# Patient Record
Sex: Female | Born: 1980 | Race: White | Hispanic: No | Marital: Married | Smoking: Never smoker
Health system: Southern US, Community
[De-identification: ages and names within clinical notes are randomized; demographics above are authoritative.]

## PROBLEM LIST (undated history)

## (undated) DIAGNOSIS — Z8669 Personal history of other diseases of the nervous system and sense organs: Secondary | ICD-10-CM

## (undated) DIAGNOSIS — N200 Calculus of kidney: Secondary | ICD-10-CM

## (undated) DIAGNOSIS — E041 Nontoxic single thyroid nodule: Secondary | ICD-10-CM

## (undated) DIAGNOSIS — K219 Gastro-esophageal reflux disease without esophagitis: Secondary | ICD-10-CM

## (undated) DIAGNOSIS — R112 Nausea with vomiting, unspecified: Secondary | ICD-10-CM

## (undated) DIAGNOSIS — F418 Other specified anxiety disorders: Secondary | ICD-10-CM

## (undated) DIAGNOSIS — G43109 Migraine with aura, not intractable, without status migrainosus: Secondary | ICD-10-CM

## (undated) DIAGNOSIS — F419 Anxiety disorder, unspecified: Secondary | ICD-10-CM

## (undated) DIAGNOSIS — D649 Anemia, unspecified: Secondary | ICD-10-CM

## (undated) DIAGNOSIS — N920 Excessive and frequent menstruation with regular cycle: Secondary | ICD-10-CM

## (undated) DIAGNOSIS — F988 Other specified behavioral and emotional disorders with onset usually occurring in childhood and adolescence: Secondary | ICD-10-CM

## (undated) DIAGNOSIS — N946 Dysmenorrhea, unspecified: Secondary | ICD-10-CM

## (undated) DIAGNOSIS — Z8489 Family history of other specified conditions: Secondary | ICD-10-CM

## (undated) HISTORY — DX: Anxiety disorder, unspecified: F41.9

## (undated) HISTORY — DX: Migraine with aura, not intractable, without status migrainosus: G43.109

## (undated) HISTORY — PX: ESOPHAGEAL DILATION: SHX303

## (undated) HISTORY — DX: Calculus of kidney: N20.0

## (undated) HISTORY — DX: Other specified behavioral and emotional disorders with onset usually occurring in childhood and adolescence: F98.8

---

## 2011-02-02 DIAGNOSIS — Z87442 Personal history of urinary calculi: Secondary | ICD-10-CM

## 2011-02-02 HISTORY — DX: Personal history of urinary calculi: Z87.442

## 2011-05-16 ENCOUNTER — Encounter (HOSPITAL_COMMUNITY): Payer: Self-pay | Admitting: *Deleted

## 2011-05-16 ENCOUNTER — Emergency Department (HOSPITAL_COMMUNITY)
Admission: EM | Admit: 2011-05-16 | Discharge: 2011-05-17 | Disposition: A | Discharge status: Home / Self Care | Payer: BC Managed Care – PPO | Attending: Emergency Medicine | Admitting: Emergency Medicine

## 2011-05-16 DIAGNOSIS — N201 Calculus of ureter: Secondary | ICD-10-CM | POA: Insufficient documentation

## 2011-05-16 DIAGNOSIS — R109 Unspecified abdominal pain: Secondary | ICD-10-CM | POA: Insufficient documentation

## 2011-05-16 DIAGNOSIS — N1330 Unspecified hydronephrosis: Secondary | ICD-10-CM | POA: Insufficient documentation

## 2011-05-16 DIAGNOSIS — R110 Nausea: Secondary | ICD-10-CM | POA: Insufficient documentation

## 2011-05-16 DIAGNOSIS — N200 Calculus of kidney: Secondary | ICD-10-CM

## 2011-05-16 MED ORDER — SODIUM CHLORIDE 0.9 % IV BOLUS (SEPSIS)
1000.0000 mL | Freq: Once | INTRAVENOUS | Status: AC
  Administered 2011-05-16: 1000 mL via INTRAVENOUS

## 2011-05-16 MED ORDER — MORPHINE SULFATE 4 MG/ML IJ SOLN
4.0000 mg | Freq: Once | INTRAMUSCULAR | Status: AC
  Administered 2011-05-16: 4 mg via INTRAVENOUS
  Filled 2011-05-16: qty 1

## 2011-05-16 MED ORDER — ONDANSETRON HCL 4 MG/2ML IJ SOLN
4.0000 mg | Freq: Once | INTRAMUSCULAR | Status: AC
Start: 2011-05-16 — End: 2011-05-16
  Administered 2011-05-16: 4 mg via INTRAVENOUS
  Filled 2011-05-16: qty 2

## 2011-05-16 NOTE — ED Notes (Signed)
Pt reports left flank pain with radiation into left lower abdomen. Pt reports nausea without vomiting. Pain started an hour and a half ago and is intermittent. Pt denies history of kidney stones

## 2011-05-17 ENCOUNTER — Emergency Department (HOSPITAL_COMMUNITY): Payer: BC Managed Care – PPO

## 2011-05-17 LAB — BASIC METABOLIC PANEL
BUN: 18 mg/dL (ref 6–23)
Calcium: 9.4 mg/dL (ref 8.4–10.5)
Creatinine, Ser: 0.8 mg/dL (ref 0.50–1.10)
GFR calc non Af Amer: >90 mL/min (ref >90)
Glucose, Bld: 135 mg/dL — ABNORMAL HIGH (ref 70–99)
Sodium: 137 mEq/L (ref 135–145)

## 2011-05-17 LAB — URINALYSIS, ROUTINE W REFLEX MICROSCOPIC
Bilirubin Urine: NEGATIVE
Specific Gravity, Urine: 1.024 (ref 1.005–1.030)
Urobilinogen, UA: 0.2 mg/dL (ref 0.0–1.0)

## 2011-05-17 LAB — CBC
HCT: 38.3 % (ref 36.0–46.0)
MCH: 31.6 pg (ref 26.0–34.0)
MCV: 91 fL (ref 78.0–100.0)
Platelets: 250 10*3/uL (ref 150–400)
RBC: 4.21 MIL/uL (ref 3.87–5.11)

## 2011-05-17 LAB — POCT PREGNANCY, URINE: Preg Test, Ur: NEGATIVE

## 2011-05-17 LAB — URINE MICROSCOPIC-ADD ON

## 2011-05-17 LAB — DIFFERENTIAL
Eosinophils Absolute: 0.1 10*3/uL (ref 0.0–0.7)
Eosinophils Relative: 1 % (ref 0–5)
Lymphs Abs: 2.3 10*3/uL (ref 0.7–4.0)
Monocytes Absolute: 0.3 10*3/uL (ref 0.1–1.0)
Monocytes Relative: 5 % (ref 3–12)

## 2011-05-17 MED ORDER — ONDANSETRON HCL 4 MG PO TABS: 4.0000 mg | ORAL_TABLET | Freq: Four times a day (QID) | ORAL | Status: AC

## 2011-05-17 MED ORDER — TAMSULOSIN HCL 0.4 MG PO CAPS: 0.4000 mg | ORAL_CAPSULE | Freq: Every day | ORAL | Status: DC

## 2011-05-17 MED ORDER — OXYCODONE-ACETAMINOPHEN 5-325 MG PO TABS: 2.0000 | ORAL_TABLET | ORAL | Status: AC | PRN

## 2011-05-17 MED ORDER — HYDROMORPHONE HCL PF 1 MG/ML IJ SOLN
0.5000 mg | Freq: Once | INTRAMUSCULAR | Status: AC
  Administered 2011-05-17: 0.5 mg via INTRAVENOUS
  Filled 2011-05-17: qty 1

## 2011-05-17 MED ORDER — ONDANSETRON HCL 4 MG/2ML IJ SOLN
4.0000 mg | Freq: Once | INTRAMUSCULAR | Status: AC
  Administered 2011-05-17: 4 mg via INTRAVENOUS
  Filled 2011-05-17: qty 2

## 2011-05-17 MED ORDER — MORPHINE SULFATE 4 MG/ML IJ SOLN
4.0000 mg | Freq: Once | INTRAMUSCULAR | Status: AC
  Administered 2011-05-17: 4 mg via INTRAVENOUS
  Filled 2011-05-17: qty 1

## 2011-05-17 MED ORDER — TAMSULOSIN HCL 0.4 MG PO CAPS
0.4000 mg | ORAL_CAPSULE | Freq: Once | ORAL | Status: AC
  Administered 2011-05-17: 0.4 mg via ORAL
  Filled 2011-05-17: qty 1

## 2011-05-17 MED ORDER — OXYCODONE-ACETAMINOPHEN 5-325 MG PO TABS
2.0000 | ORAL_TABLET | Freq: Once | ORAL | Status: AC
  Administered 2011-05-17: 2 via ORAL
  Filled 2011-05-17: qty 2

## 2011-05-17 NOTE — Discharge Instructions (Signed)
You were diagnosed with a kidney stone today.  Please contact [facility] today to schedule follow-up.  Return to the ER for worsening pain, nausea, or vomiting despite medications, fever, or other concerning symptoms.  Kidney Stones Kidney stones (ureteral lithiasis) are deposits that form inside your kidneys. The intense pain is caused by the stone moving through the urinary tract. When the stone moves, the ureter goes into spasm around the stone. The stone is usually passed in the urine.  CAUSES   A disorder that makes certain neck glands produce too much parathyroid hormone (primary hyperparathyroidism).   A buildup of uric acid crystals.   Narrowing (stricture) of the ureter.   A kidney obstruction present at birth (congenital obstruction).   Previous surgery on the kidney or ureters.   Numerous kidney infections.  SYMPTOMS   Feeling sick to your stomach (nauseous).   Throwing up (vomiting).   Blood in the urine (hematuria).   Pain that usually spreads (radiates) to the groin.   Frequency or urgency of urination.  DIAGNOSIS   Taking a history and physical exam.   Blood or urine tests.   Computerized X-ray scan (CT scan).   Occasionally, an examination of the inside of the urinary bladder (cystoscopy) is performed.  TREATMENT   Observation.   Increasing your fluid intake.   Surgery may be needed if you have severe pain or persistent obstruction.  The size, location, and chemical composition are all important variables that will determine the proper choice of action for you. Talk to your caregiver to better understand your situation so that you will minimize the risk of injury to yourself and your kidney.  HOME CARE INSTRUCTIONS   Drink enough water and fluids to keep your urine clear or pale yellow.   Strain all urine through the provided strainer. Keep all particulate matter and stones for your caregiver to see. The stone causing the pain may be as small as  a grain of salt. It is very important to use the strainer each and every time you pass your urine. The collection of your stone will allow your caregiver to analyze it and verify that a stone has actually passed.   Only take over-the-counter or prescription medicines for pain, discomfort, or fever as directed by your caregiver.   Make a follow-up appointment with your caregiver as directed.   Get follow-up X-rays if required. The absence of pain does not always mean that the stone has passed. It may have only stopped moving. If the urine remains completely obstructed, it can cause loss of kidney function or even complete destruction of the kidney. It is your responsibility to make sure X-rays and follow-ups are completed. Ultrasounds of the kidney can show blockages and the status of the kidney. Ultrasounds are not associated with any radiation and can be performed easily in a matter of minutes.  SEEK IMMEDIATE MEDICAL CARE IF:   Pain cannot be controlled with the prescribed medicine.   You have a fever.   The severity or intensity of pain increases over 18 hours and is not relieved by pain medicine.   You develop a new onset of abdominal pain.   You feel faint or pass out.  MAKE SURE YOU:   Understand these instructions.   Will watch your condition.   Will get help right away if you are not doing well or get worse.  Document Released: 01/18/2005 Document Revised: 01/07/2011 Document Reviewed: 05/16/2009 [facility] Patient Information 2012 [place], [place].

## 2011-05-17 NOTE — ED Notes (Signed)
Pt aware of need for urine specimen. Pt unable to provide one at this time. RN notified. Will continue to monitor.

## 2011-05-17 NOTE — ED Provider Notes (Signed)
History     CSN: [id number]  Arrival date & time 05/16/11  2208   First MD Initiated Contact with Patient 05/16/11 2308      Chief Complaint  Patient presents with  . Nausea  . Flank Pain    (Consider location/radiation/quality/duration/timing/severity/associated sxs/prior treatment) HPI 31 year old female presents to emergency room with with complaint of left flank pain radiating around into her abdomen. Patient reports acute onset around 9 PM tonight. Patient denies previous history of similar pain. Patient has had nausea but no vomiting with this pain. Pain comes in waves, sharp and severe. Patient reports last menstrual period was 2 weeks ago. Patient has been having normal bowel movements. No fevers. History reviewed. No pertinent past medical history.  History reviewed. No pertinent past surgical history.  No family history on file.  History  Substance Use Topics  . Smoking status: Never Smoker   . Smokeless tobacco: Not on file  . Alcohol Use: No    OB History    Grav Para Term Preterm Abortions TAB SAB Ect Mult Living                  Review of Systems  All other systems reviewed and are negative.   other than listed in history of present illness  Allergies  Review of patient's allergies indicates no known allergies.  Home Medications  No current outpatient prescriptions on file.  BP 146/65  Pulse 92  Temp(Src) 98 F (36.7 C) (Oral)  Resp 18  SpO2 97%  LMP 04/26/2011  Physical Exam  Nursing note and vitals reviewed. Constitutional: She appears well-developed and well-nourished. She appears distressed.       Patient standing on side of bed leaning over in significant pain  HENT:  Head: Normocephalic and atraumatic.  Eyes: Conjunctivae and EOM are normal. Pupils are equal, round, and reactive to light.  Neck: Normal range of motion. Neck supple. No JVD present. No tracheal deviation present. No thyromegaly present.  Cardiovascular: Normal rate,  regular rhythm, normal heart sounds and intact distal pulses.  Exam reveals no gallop and no friction rub.   No murmur heard. Pulmonary/Chest: Effort normal and breath sounds normal. No stridor. No respiratory distress. She has no wheezes. She has no rales. She exhibits no tenderness.  Abdominal: Soft. Bowel sounds are normal. She exhibits no distension and no mass. There is tenderness (tenderness with palpation of left upper quadrant and left flank with positive CVA tenderness). There is no rebound and no guarding.  Musculoskeletal: Normal range of motion. She exhibits no edema and no tenderness.  Lymphadenopathy:    She has no cervical adenopathy.  Neurological: She is alert. She exhibits normal muscle tone. Coordination normal.  Skin: Skin is warm and dry. No rash noted. No erythema. No pallor.  Psychiatric: She has a normal mood and affect. Her behavior is normal. Judgment and thought content normal.    ED Course  Procedures (including critical care time)  Labs Reviewed  URINALYSIS, ROUTINE W REFLEX MICROSCOPIC - Abnormal; Notable for the following:    APPearance CLOUDY (*)    Hgb urine dipstick SMALL (*)    Ketones, ur 15 (*)    All other components within normal limits  BASIC METABOLIC PANEL - Abnormal; Notable for the following:    Potassium 3.3 (*)    Glucose, Bld 135 (*)    All other components within normal limits  URINE MICROSCOPIC-ADD ON - Abnormal; Notable for the following:    Bacteria, UA FEW (*)  Crystals CA OXALATE CRYSTALS (*)    All other components within normal limits  CBC  DIFFERENTIAL  LACTIC ACID, PLASMA  POCT PREGNANCY, URINE   Ct Abdomen Pelvis Wo Contrast  05/17/2011  *RADIOLOGY REPORT*  Clinical Data: Left flank pain, radiating to the left lower abdomen for several hours.  CT ABDOMEN AND PELVIS WITHOUT CONTRAST  Technique:  Multidetector CT imaging of the abdomen and pelvis was performed following the standard protocol without intravenous contrast.   Comparison: None.  Findings: Minimal bibasilar atelectasis is noted.  The liver and spleen are unremarkable in appearance.  The gallbladder is within normal limits.  The pancreas and adrenal glands are unremarkable.  There is mild to moderate left-sided hydronephrosis, with left- sided perinephric stranding, and prominence of the left ureter to the level of an obstructing 6 x 5 mm stone in the distal left ureter, just proximal to the left vesicoureteral junction.  A tiny nonobstructing 3 mm stone is noted near the lower pole of the left kidney.  The right kidney is unremarkable in appearance.  No free fluid is identified.  The small bowel is unremarkable in appearance.  The stomach is within normal limits.  No acute vascular abnormalities are seen.  The appendix is normal in caliber, without evidence for appendicitis.  The colon is grossly unremarkable in appearance.  The bladder is mildly distended; mild bladder wall thickening could reflect mild cystitis.  The uterus is grossly unremarkable in appearance.  The ovaries are relatively symmetric, with a small 1.8 cm right adnexal follicle incidentally noted.  No suspicious adnexal masses are seen.  No inguinal lymphadenopathy is seen.  No acute osseous abnormalities are identified.  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  IMPRESSION:  1.  Mild to moderate left-sided hydronephrosis, with left-sided perinephric stranding, and an obstructing 6 x 5 mm stone in the distal left ureter, just proximal to the left vesicoureteral junction. 2.  Tiny nonobstructing 3 mm stone noted near the lower pole of the left kidney. 3.  Mild bladder wall thickening could reflect mild cystitis. 4.  Chronic bilateral pars defects at L5, without evidence of anterolisthesis.  Original Report Authenticated By: Tonia Ghent, M.D.     1. Nephrolithiasis       MDM  31 year old female with left flank pain that seems colicky in nature, possibly  due to 2 kidney stone. Patient to receive fluids, pain medicine. We'll check labs in urine. Patient may require CT scan for localization of stone. Other differential includes ovarian torsion, intestinal colic, or ectopic pregnancy        Olivia Mackie, MD 05/17/11 [phone]

## 2011-05-17 NOTE — ED Notes (Signed)
Pt. Alert and oriented discharged to home via w/c with husband

## 2011-05-27 ENCOUNTER — Other Ambulatory Visit: Payer: Self-pay | Admitting: Urology

## 2011-05-31 ENCOUNTER — Encounter (HOSPITAL_BASED_OUTPATIENT_CLINIC_OR_DEPARTMENT_OTHER): Payer: Self-pay | Admitting: *Deleted

## 2011-05-31 NOTE — Progress Notes (Signed)
To [facility] @1100 - KUB, Hg,urine pregnancy on arrival.Npo after mn.

## 2011-06-03 NOTE — H&P (Signed)
  History of Present Illness     Nephrolithiasis: The patient was seen in the emergency room on 05/16/11 with left flank pain and a CT scan revealed moderate left hydronephrosis due to a stone in the distal ureter measuring 6 x 5 mm. The study also revealed a single 3 mm, nonobstructing stone in the left kidney.    Interval HX: She was started on medical expulsive therapy with tamsulosin. A KUB confirmed the stone in the distal left ureter when she was here last. She has had intermittent mild discomfort in the left lower quadrant and has not seen her stone pass.   Past Medical History Problems  1. History of  Anxiety (Symptom) 300.00  Surgical History Problems  1. History of  No Surgical Problems  Current Meds 1. No Reported Medications  Allergies Medication  1. No Known Drug Allergies  Family History Problems  1. Family history of  Family Health Status Number Of Children 2 sons  Social History Problems  1. Marital History - Currently Married 2. Never A Smoker 3. Occupation: stay at home mom Denied  4. History of  Alcohol Use 5. History of  Caffeine Use 6. History of  Tobacco Use  Review of Systems Genitourinary, constitutional, skin, eye, otolaryngeal, hematologic/lymphatic, cardiovascular, pulmonary, endocrine, musculoskeletal, gastrointestinal, neurological and psychiatric system(s) were reviewed and pertinent findings if present are noted.  Gastrointestinal: nausea, vomiting and abdominal pain.  Psychiatric: anxiety.    Vitals Vital Signs  BMI Calculated: 27.11 BSA Calculated: 1.73 Height: 5 ft 3 in Weight: 153 lb  Blood Pressure: 121 / 78 Temperature: 98.4 F Heart Rate: 77  Physical Exam Constitutional: Well nourished and well developed . No acute distress. The patient appears well hydrated.  ENT:. The ears and nose are normal in appearance.  Neck: The appearance of the neck is normal.  Pulmonary: No respiratory distress.  Cardiovascular: Heart rate and  rhythm are normal.  Abdomen: The abdomen is flat. The abdomen is soft and nontender. No suprapubic tenderness. No CVA tenderness.  Genitourinary:. Deferred.  Skin: Normal skin turgor and normal skin color and pigmentation.  Neuro/Psych:. Mood and affect are appropriate.    Assessment Assessed  1. Distal Ureteral Stone On The Left 592.1 2. Nephrolithiasis Of The Left Kidney 592.0      I went over the treatment options with the patient today which would include lithotripsy versus ureteroscopy and laser lithotripsy. We discussed each of the procedures in detail including its pluses and minuses as well as risks and complications. We also discussed continued medical expulsive therapy however she would like to proceed with a definitive treatment it is elected to proceed with ureteroscopic extraction of her stone.   Plan: She will undergo left ureteroscopy with laser lithotripsy.

## 2011-06-04 ENCOUNTER — Ambulatory Visit (HOSPITAL_BASED_OUTPATIENT_CLINIC_OR_DEPARTMENT_OTHER)
Admission: RE | Admit: 2011-06-04 | Discharge: 2011-06-04 | Disposition: A | Discharge status: Home / Self Care | Payer: BC Managed Care – PPO | Source: Ambulatory Visit | Attending: Urology | Admitting: Urology

## 2011-06-04 ENCOUNTER — Ambulatory Visit (HOSPITAL_BASED_OUTPATIENT_CLINIC_OR_DEPARTMENT_OTHER): Payer: BC Managed Care – PPO | Admitting: Anesthesiology

## 2011-06-04 ENCOUNTER — Encounter (HOSPITAL_BASED_OUTPATIENT_CLINIC_OR_DEPARTMENT_OTHER): Payer: Self-pay | Admitting: Anesthesiology

## 2011-06-04 ENCOUNTER — Encounter (HOSPITAL_BASED_OUTPATIENT_CLINIC_OR_DEPARTMENT_OTHER)
Admission: RE | Disposition: A | Discharge status: Home / Self Care | Payer: Self-pay | Source: Ambulatory Visit | Attending: Urology

## 2011-06-04 ENCOUNTER — Ambulatory Visit (HOSPITAL_COMMUNITY): Payer: BC Managed Care – PPO

## 2011-06-04 ENCOUNTER — Encounter (HOSPITAL_BASED_OUTPATIENT_CLINIC_OR_DEPARTMENT_OTHER): Payer: Self-pay | Admitting: *Deleted

## 2011-06-04 DIAGNOSIS — N200 Calculus of kidney: Secondary | ICD-10-CM | POA: Insufficient documentation

## 2011-06-04 DIAGNOSIS — N201 Calculus of ureter: Secondary | ICD-10-CM

## 2011-06-04 HISTORY — DX: Calculus of kidney: N20.0

## 2011-06-04 HISTORY — PX: URETEROSCOPY: SHX842

## 2011-06-04 LAB — POCT PREGNANCY, URINE: Preg Test, Ur: NEGATIVE

## 2011-06-04 LAB — POCT HEMOGLOBIN-HEMACUE: Hemoglobin: 13 g/dL (ref 12.0–15.0)

## 2011-06-04 SURGERY — URETEROSCOPY
Anesthesia: General | Laterality: Left

## 2011-06-04 MED ORDER — DEXAMETHASONE SODIUM PHOSPHATE 4 MG/ML IJ SOLN
INTRAMUSCULAR | Status: DC | PRN
  Administered 2011-06-04: 10 mg via INTRAVENOUS

## 2011-06-04 MED ORDER — LACTATED RINGERS IV SOLN
INTRAVENOUS | Status: DC
  Administered 2011-06-04: 12:00:00 via INTRAVENOUS

## 2011-06-04 MED ORDER — PROPOFOL 10 MG/ML IV EMUL
INTRAVENOUS | Status: DC | PRN
  Administered 2011-06-04: 200 mg via INTRAVENOUS

## 2011-06-04 MED ORDER — KETOROLAC TROMETHAMINE 30 MG/ML IJ SOLN
INTRAMUSCULAR | Status: DC | PRN
  Administered 2011-06-04: 30 mg via INTRAVENOUS

## 2011-06-04 MED ORDER — CIPROFLOXACIN IN D5W 200 MG/100ML IV SOLN
200.0000 mg | INTRAVENOUS | Status: AC
  Administered 2011-06-04: 200 mg via INTRAVENOUS

## 2011-06-04 MED ORDER — LIDOCAINE HCL (CARDIAC) 20 MG/ML IV SOLN
INTRAVENOUS | Status: DC | PRN
  Administered 2011-06-04: 100 mg via INTRAVENOUS

## 2011-06-04 MED ORDER — MIDAZOLAM HCL 5 MG/5ML IJ SOLN
INTRAMUSCULAR | Status: DC | PRN
  Administered 2011-06-04: 2 mg via INTRAVENOUS

## 2011-06-04 MED ORDER — ONDANSETRON HCL 4 MG/2ML IJ SOLN
INTRAMUSCULAR | Status: DC | PRN
  Administered 2011-06-04: 4 mg via INTRAVENOUS

## 2011-06-04 MED ORDER — BELLADONNA ALKALOIDS-OPIUM 16.2-60 MG RE SUPP
RECTAL | Status: DC | PRN
  Administered 2011-06-04: 1 via RECTAL

## 2011-06-04 MED ORDER — FENTANYL CITRATE 0.05 MG/ML IJ SOLN
INTRAMUSCULAR | Status: DC | PRN
  Administered 2011-06-04: 100 ug via INTRAVENOUS

## 2011-06-04 MED ORDER — SODIUM CHLORIDE 0.9 % IR SOLN
Status: DC | PRN
  Administered 2011-06-04: 500 mL

## 2011-06-04 MED ORDER — PHENAZOPYRIDINE HCL 200 MG PO TABS: 200.0000 mg | ORAL_TABLET | Freq: Three times a day (TID) | ORAL | Status: AC | PRN

## 2011-06-04 MED ORDER — FENTANYL CITRATE 0.05 MG/ML IJ SOLN: 25.0000 ug | INTRAMUSCULAR | Status: DC | PRN

## 2011-06-04 MED ORDER — IOHEXOL 350 MG/ML SOLN
INTRAVENOUS | Status: DC | PRN
  Administered 2011-06-04: 5 mL

## 2011-06-04 SURGICAL SUPPLY — 22 items
ADAPTER CATH URET PLST 4-6FR (CATHETERS) IMPLANT
BAG DRAIN URO-CYSTO SKYTR STRL (DRAIN) ×2 IMPLANT
BASKET LASER NITINOL 1.9FR (BASKET) IMPLANT
BASKET STNLS GEMINI 4WIRE 3FR (BASKET) IMPLANT
BASKET ZERO TIP NITINOL 2.4FR (BASKET) ×2 IMPLANT
CANISTER SUCT LVC 12 LTR MEDI- (MISCELLANEOUS) IMPLANT
CATH INTERMIT  6FR 70CM (CATHETERS) ×2 IMPLANT
CLOTH BEACON ORANGE TIMEOUT ST (SAFETY) ×2 IMPLANT
DRAPE CAMERA CLOSED 9X96 (DRAPES) ×2 IMPLANT
ELECT REM PT RETURN 9FT ADLT (ELECTROSURGICAL)
ELECTRODE REM PT RTRN 9FT ADLT (ELECTROSURGICAL) IMPLANT
GLOVE BIO SURGEON STRL SZ8 (GLOVE) ×2 IMPLANT
GOWN PREVENTION PLUS LG XLONG (DISPOSABLE) ×2 IMPLANT
GOWN STRL REIN XL XLG (GOWN DISPOSABLE) ×2 IMPLANT
GUIDEWIRE 0.038 PTFE COATED (WIRE) IMPLANT
GUIDEWIRE ANG ZIPWIRE 038X150 (WIRE) IMPLANT
GUIDEWIRE STR DUAL SENSOR (WIRE) ×2 IMPLANT
IV NS IRRIG 3000ML ARTHROMATIC (IV SOLUTION) ×4 IMPLANT
LASER FIBER DISP (UROLOGICAL SUPPLIES) ×2 IMPLANT
PACK CYSTOSCOPY (CUSTOM PROCEDURE TRAY) ×2 IMPLANT
SHEATH URET ACCESS 12FR/35CM (UROLOGICAL SUPPLIES) ×2 IMPLANT
WATER STERILE IRR 3000ML UROMA (IV SOLUTION) IMPLANT

## 2011-06-04 NOTE — Anesthesia Preprocedure Evaluation (Signed)
Anesthesia Evaluation  Patient identified by MRN, date of birth, ID band Patient awake    Reviewed: Allergy & Precautions, H&P , NPO status , Patient's Chart, lab work & pertinent test results, reviewed documented beta blocker date and time   Airway Mallampati: II TM Distance: >3 FB Neck ROM: Full    Dental  (+) Teeth Intact and Dental Advisory Given   Pulmonary neg pulmonary ROS,  breath sounds clear to auscultation        Cardiovascular negative cardio ROS  Rhythm:Regular Rate:Normal  Denies cardiac symptoms   Neuro/Psych negative neurological ROS  negative psych ROS   GI/Hepatic negative GI ROS, Neg liver ROS,   Endo/Other  negative endocrine ROS  Renal/GU Kidney stone  negative genitourinary   Musculoskeletal negative musculoskeletal ROS (+)   Abdominal   Peds negative pediatric ROS (+)  Hematology negative hematology ROS (+)   Anesthesia Other Findings Upper front cap  Reproductive/Obstetrics negative OB ROS                           Anesthesia Physical Anesthesia Plan  ASA: I  Anesthesia Plan: General   Post-op Pain Management:    Induction: Intravenous  Airway Management Planned: LMA  Additional Equipment:   Intra-op Plan:   Post-operative Plan: Extubation in OR  Informed Consent: I have reviewed the patients History and Physical, chart, labs and discussed the procedure including the risks, benefits and alternatives for the proposed anesthesia with the patient or authorized representative who has indicated his/her understanding and acceptance.   Dental advisory given  Plan Discussed with: CRNA and Surgeon  Anesthesia Plan Comments:         Anesthesia Quick Evaluation

## 2011-06-04 NOTE — Anesthesia Postprocedure Evaluation (Signed)
  Anesthesia Post-op Note  Patient: Brittany Hart  Procedure(s) Performed: Procedure(s) (LRB): URETEROSCOPY (Left) HOLMIUM LASER APPLICATION (Left)  Patient Location: PACU  Anesthesia Type: General  Level of Consciousness: awake and alert   Airway and Oxygen Therapy: Patient Spontanous Breathing  Post-op Pain: mild  Post-op Assessment: Post-op Vital signs reviewed, Patient's Cardiovascular Status Stable, Respiratory Function Stable, Patent Airway and No signs of Nausea or vomiting  Post-op Vital Signs: stable  Complications: No apparent anesthesia complications

## 2011-06-04 NOTE — Transfer of Care (Signed)
Immediate Anesthesia Transfer of Care Note  Patient: Brittany Hart  Procedure(s) Performed: Procedure(s) (LRB): URETEROSCOPY (Left) HOLMIUM LASER APPLICATION (Left)  Patient Location: PACU  Anesthesia Type: General  Level of Consciousness: awake, alert  and oriented  Airway & Oxygen Therapy: Patient Spontanous Breathing and Patient connected to face mask oxygen  Post-op Assessment: Report given to PACU RN and Post -op Vital signs reviewed and stable  Post vital signs: Reviewed and stable  Complications: No apparent anesthesia complications

## 2011-06-04 NOTE — Anesthesia Procedure Notes (Signed)
Procedure Name: LMA Insertion Date/Time: 06/04/2011 12:16 PM Performed by: Norva Pavlov Pre-anesthesia Checklist: Patient identified, Emergency Drugs available, Suction available and Patient being monitored Patient Re-evaluated:Patient Re-evaluated prior to inductionOxygen Delivery Method: Circle System Utilized Preoxygenation: Pre-oxygenation with 100% oxygen Intubation Type: IV induction Ventilation: Mask ventilation without difficulty LMA: LMA inserted LMA Size: 4.0 Number of attempts: 1 Airway Equipment and Method: bite block Placement Confirmation: positive ETCO2 Tube secured with: Tape Dental Injury: Teeth and Oropharynx as per pre-operative assessment

## 2011-06-04 NOTE — Discharge Instructions (Signed)
Post stone removal surgery instructions   Definitions:  Ureter: The duct that transports urine from the kidney to the bladder. Stent: A plastic hollow tube that is placed into the ureter, from the kidney to the bladder to prevent the ureter from swelling shut.  General instructions:  Despite the fact that no skin incisions were used, the area around the ureter and bladder is raw and irritated. The stent is a foreign body which will further irritate the bladder wall. This irritation is manifested by increased frequency of urination, both day and night, and by an increase in the urge to urinate. In some, the urge to urinate is present almost always. Sometimes the urge is strong enough that you may not be able to stop your self from urinating. The only real cure is to remove the stent and then give time for the bladder wall to heal which can't be done until the danger of the ureter swelling shut has passed. (This varies from 2-21 days).  You may see some blood in your urine while the stent is in place and a few days afterward. Do not be alarmed, even if the urine is clear for a while. Get off your feet and drink lots of fluids until clearing occurs. If you start to pass clots or don't improve, call us.  If you have a string coming from your urethra:  The stent string is attached to your ureteral stent.  Do not pull on thisIf you have a string coming from your urethra:  The stent string is attached to your ureteral stent.  Do not pull on this.  Diet:  You may return to your normal diet immediately. Because of the raw surface of your bladder, alcohol, spicy foods, foods high in acid and drinks with caffeine may cause irritation or frequency and should be used in moderation. To keep your urine flowing freely and avoid constipation, drink plenty of fluids during the day (8-10 glasses). Tip: Avoid cranberry juice because it is very acidic.  Activity:  Your physical activity doesn't need to be  restricted. However, if you are very active, you may see some blood in the urine. We suggest that you reduce your activity under the circumstances until the bleeding has stopped.  Bowels:  It is important to keep your bowels regular during the postoperative period. Straining with bowel movements can cause bleeding. A bowel movement every other day is reasonable. Use a mild laxative if needed, such as milk of magnesia 2-3 tablespoons, or 2 Dulcolax tablets. Call if you continue to have problems. If you had been taking narcotics for pain, before, during or after your surgery, you may be constipated. Take a laxative if necessary.     Medication:  You should resume your pre-surgery medications unless told not to. DO NOT RESUME YOUR ASPIRIN, or any other medicines like ibuprofen, motrin, excedrin, advil, aleve, vitamin E, fish oil as these can all cause bleeding x 7 days. In addition you may be given an antibiotic to prevent or treat infection. Antibiotics are not always necessary. All medication should be taken as prescribed until the bottles are finished unless you are having an unusual reaction to one of the drugs.  Problems you should report to [place]:  a. Fever greater than 101F. b. Heavy bleeding, or clots (see notes above about blood in urine). c. Inability to urinate. d. Drug reactions (hives, rash, nausea, vomiting, diarrhea). e. Severe burning or pain with urination that is not improving.  Followup:  You  will need a followup appointment to monitor your progress in most cases. Please call the office for this appointment when you get home if your appointment has not already been scheduled. Usually the first appointment will be about 5-14 days after your surgery and if you have a stent in place it will likely be removed at that time.  Post Anesthesia Home Care Instructions  Activity: Get plenty of rest for the remainder of the day. A responsible adult should stay with you for 24 hours  following the procedure.  For the next 24 hours, DO NOT: -Drive a car -Advertising copywriter -Drink alcoholic beverages -Take any medication unless instructed by your physician -Make any legal decisions or sign important papers.  Meals: Start with liquid foods such as gelatin or soup. Progress to regular foods as tolerated. Avoid greasy, spicy, heavy foods. If nausea and/or vomiting occur, drink only clear liquids until the nausea and/or vomiting subsides. Call your physician if vomiting continues.  Special Instructions/Symptoms: Your throat may feel dry or sore from the anesthesia or the breathing tube placed in your throat during surgery. If this causes discomfort, gargle with warm salt water. The discomfort should disappear within 24 hours.

## 2011-06-04 NOTE — Op Note (Signed)
PATIENT:  Brittany Hart  PRE-OPERATIVE DIAGNOSIS:  left Ureteral calculus  POST-OPERATIVE DIAGNOSIS: Same  PROCEDURE: 1. Cystoscopy with left retrograde pyelogram including interpretation. 2. Left ureteroscopy with laser lithotripsy. 3. Left ureteroscopic stone extraction  SURGEON: Garnett Farm, MD  INDICATION: [name] is a 31 year old female patient with a left distal ureteral stone. She was placed on medical expulsive therapy however her stone failed to progress. She presents today for ureteroscopic treatment of her stone. ANESTHESIA:  General  EBL:  Minimal  DRAINS: None  SPECIMEN:  Stone  DISPOSITION OF SPECIMEN:  given to patient  DESCRIPTION OF PROCEDURE: The patient was taken to the major OR and placed on the table. General anesthesia was administered and then the patient was moved to the dorsal lithotomy position. The genitalia was sterilely prepped and draped. An official timeout was performed.  Initially the 22 French cystoscope with 12 lens was passed under direct vision into the bladder. The bladder was then entered and fully inspected. It was noted be free of any tumors stones or inflammatory lesions. Ureteral orifices were of normal configuration and position. A 6 French open-ended ureteral catheter was then passed through the cystoscope into the ureteral orifice in order to perform a left retrograde pyelogram.  A retrograde pyelogram was performed by injecting full-strength contrast up the left ureter under direct fluoroscopic control. It revealed a filling defect in the distal lureter consistent with the stone seen on the preoperative KUB. The remainder of the ureter was noted to be normal as was the intrarenal collecting system. I then passed a 0.038 inch floppy-tipped guidewire through the open ended catheter and into the area of the renal pelvis and this was left in place. The inner portion of a ureteral access sheath was then passed over the guidewire to gently  dilate the intramural ureter. I then proceeded with ureteroscopy.  A 6 French rigid ureteroscope was then passed under direct into the bladder and into the left orifice and up the ureter. The stone was identified and I felt it was too large to extract and therefore elected to proceed with laser lithotripsy. The 200  holmium laser fiber was used to fragment the stone. I then used the nitinol basket to extract all of the stone fragments and reinspection of the ureter ureteroscopically revealed no further stone fragments and no injury to the ureter. The bladder was drained and the cystoscope was then removed. The patient tolerated the procedure well no intraoperative complications.  PLAN OF CARE: Discharge to home after PACU  PATIENT DISPOSITION:  PACU - hemodynamically stable.

## 2011-06-04 NOTE — OR Nursing (Signed)
Holmium lase used for case but unable to document or put laser in EPIC because the system was locked out.  1610-9604 was the time that the laser was used.  1.2 joules  12 HZ  14.4 W  162 pulses used during procedure

## 2011-06-04 NOTE — Interval H&P Note (Signed)
History and Physical Interval Note:  06/04/2011 11:40 AM  Brittany Hart  has presented today for surgery, with the diagnosis of Left Ureteral Stone  The various methods of treatment have been discussed with the patient and family. After consideration of risks, benefits and other options for treatment, the patient has consented to  Procedure(s) (LRB): URETEROSCOPY (Left) HOLMIUM LASER APPLICATION (Left) as a surgical intervention .  The patients' history has been reviewed, patient examined, no change in status, stable for surgery.  I have reviewed the patients' chart and labs.  Questions were answered to the patient's satisfaction.     Garnett Farm

## 2011-06-04 NOTE — Anesthesia Postprocedure Evaluation (Signed)
  Anesthesia Post-op Note  Patient: Brittany Hart  Procedure(s) Performed: Procedure(s) (LRB): URETEROSCOPY (Left) HOLMIUM LASER APPLICATION (Left)  Patient Location: PACU  Anesthesia Type: General  Level of Consciousness: awake and alert   Airway and Oxygen Therapy: Patient Spontanous Breathing  Post-op Pain: mild  Post-op Assessment: Post-op Vital signs reviewed, Patient's Cardiovascular Status Stable, Respiratory Function Stable, Patent Airway and No signs of Nausea or vomiting  Post-op Vital Signs: stable  Complications: No apparent anesthesia complications 

## 2011-06-05 NOTE — Anesthesia Postprocedure Evaluation (Signed)
  Anesthesia Post-op Note  Patient: Brittany Hart  Procedure(s) Performed: Procedure(s) (LRB): URETEROSCOPY (Left) HOLMIUM LASER APPLICATION (Left)  Patient Location: PACU  Anesthesia Type: General  Level of Consciousness: oriented and sedated  Airway and Oxygen Therapy: Patient Spontanous Breathing  Post-op Pain: mild  Post-op Assessment: Post-op Vital signs reviewed, Patient's Cardiovascular Status Stable, Respiratory Function Stable and Patent Airway  Post-op Vital Signs: stable  Complications: No apparent anesthesia complications

## 2011-06-07 ENCOUNTER — Encounter (HOSPITAL_BASED_OUTPATIENT_CLINIC_OR_DEPARTMENT_OTHER): Payer: Self-pay | Admitting: Urology

## 2011-06-09 ENCOUNTER — Encounter (HOSPITAL_BASED_OUTPATIENT_CLINIC_OR_DEPARTMENT_OTHER): Payer: Self-pay

## 2011-10-18 ENCOUNTER — Ambulatory Visit: Payer: BC Managed Care – PPO | Attending: Family Medicine

## 2011-10-18 DIAGNOSIS — M545 Low back pain, unspecified: Secondary | ICD-10-CM | POA: Insufficient documentation

## 2011-10-18 DIAGNOSIS — R5381 Other malaise: Secondary | ICD-10-CM | POA: Insufficient documentation

## 2011-10-18 DIAGNOSIS — IMO0001 Reserved for inherently not codable concepts without codable children: Secondary | ICD-10-CM | POA: Insufficient documentation

## 2011-10-21 ENCOUNTER — Ambulatory Visit: Payer: BC Managed Care – PPO | Admitting: Physical Therapy

## 2011-10-25 ENCOUNTER — Ambulatory Visit: Payer: BC Managed Care – PPO | Admitting: Physical Therapy

## 2011-10-28 ENCOUNTER — Ambulatory Visit: Payer: BC Managed Care – PPO

## 2011-11-01 ENCOUNTER — Ambulatory Visit: Payer: BC Managed Care – PPO | Admitting: Physical Therapy

## 2011-11-04 ENCOUNTER — Encounter: Payer: BC Managed Care – PPO | Admitting: Physical Therapy

## 2011-11-08 ENCOUNTER — Encounter: Payer: BC Managed Care – PPO | Admitting: Physical Therapy

## 2011-11-11 ENCOUNTER — Encounter: Payer: BC Managed Care – PPO | Admitting: Physical Therapy

## 2011-11-18 ENCOUNTER — Encounter: Payer: BC Managed Care – PPO | Admitting: Physical Therapy

## 2012-03-14 ENCOUNTER — Other Ambulatory Visit: Payer: Self-pay | Admitting: Family Medicine

## 2012-03-14 ENCOUNTER — Ambulatory Visit
Admission: RE | Admit: 2012-03-14 | Discharge: 2012-03-14 | Disposition: A | Discharge status: Home / Self Care | Payer: BC Managed Care – PPO | Source: Ambulatory Visit | Attending: Family Medicine | Admitting: Family Medicine

## 2012-03-14 DIAGNOSIS — M549 Dorsalgia, unspecified: Secondary | ICD-10-CM

## 2012-12-11 ENCOUNTER — Other Ambulatory Visit: Payer: Self-pay | Admitting: Family Medicine

## 2012-12-11 ENCOUNTER — Other Ambulatory Visit (HOSPITAL_COMMUNITY)
Admission: RE | Admit: 2012-12-11 | Discharge: 2012-12-11 | Disposition: A | Discharge status: Home / Self Care | Payer: BC Managed Care – PPO | Source: Ambulatory Visit | Attending: Family Medicine | Admitting: Family Medicine

## 2012-12-11 DIAGNOSIS — Z124 Encounter for screening for malignant neoplasm of cervix: Secondary | ICD-10-CM | POA: Insufficient documentation

## 2013-02-20 ENCOUNTER — Ambulatory Visit
Admission: RE | Admit: 2013-02-20 | Discharge: 2013-02-20 | Disposition: A | Discharge status: Home / Self Care | Payer: BC Managed Care – PPO | Source: Ambulatory Visit | Attending: Family Medicine | Admitting: Family Medicine

## 2013-02-20 ENCOUNTER — Other Ambulatory Visit: Payer: Self-pay | Admitting: Family Medicine

## 2013-02-20 DIAGNOSIS — M542 Cervicalgia: Secondary | ICD-10-CM

## 2013-03-02 ENCOUNTER — Encounter: Payer: BC Managed Care – PPO | Admitting: Neurology

## 2013-03-08 ENCOUNTER — Ambulatory Visit (INDEPENDENT_AMBULATORY_CARE_PROVIDER_SITE_OTHER): Payer: BC Managed Care – PPO | Admitting: Neurology

## 2013-03-08 ENCOUNTER — Ambulatory Visit (INDEPENDENT_AMBULATORY_CARE_PROVIDER_SITE_OTHER): Payer: Self-pay | Admitting: Radiology

## 2013-03-08 ENCOUNTER — Encounter (INDEPENDENT_AMBULATORY_CARE_PROVIDER_SITE_OTHER): Payer: Self-pay

## 2013-03-08 DIAGNOSIS — Z0289 Encounter for other administrative examinations: Secondary | ICD-10-CM

## 2013-03-08 DIAGNOSIS — M79602 Pain in left arm: Secondary | ICD-10-CM

## 2013-03-08 DIAGNOSIS — M62838 Other muscle spasm: Secondary | ICD-10-CM

## 2013-03-08 DIAGNOSIS — M79609 Pain in unspecified limb: Secondary | ICD-10-CM

## 2013-03-08 NOTE — Procedures (Signed)
   NCS (NERVE CONDUCTION STUDY) WITH EMG (ELECTROMYOGRAPHY) REPORT   STUDY DATE: February fifths 2015 PATIENT NAME: Brittany RakesBarbara Hart DOB: 10/29/1980 MRN: [id number]    TECHNOLOGIST: Gearldine ShownLorraine Jones ELECTROMYOGRAPHER: Levert FeinsteinYan, Pascuala Klutts M.D.  CLINICAL INFORMATION:  33 years old right-handed Caucasian female, presenting with subacute onset of left shoulder, armpit, ulnar forearm shooting discomfort since January 2015, getting worse by bending down her neck  On examination: Bilateral upper extremity motor strength was normal, deep tendon reflex was normal and symmetric. She has hyperreflexia of bilateral lower extremity    FINDINGS: NERVE CONDUCTION STUDY:  Left median, ulnar, radial sensory responses were normal. Bilateral lateral anticutaneous sensory response was absent. Left median and ulnar motor responses were normal.    NEEDLE ELECTROMYOGRAPHY:  Selected needle examination was performed at left upper extremity muscles, left cervical paraspinal muscles   Needle examination of left pronator teres, extensor digitorum communis, biceps, triceps, deltoid, flexor carpi ulnaris, left supraspinatus, infraspinatus was normal  There was no spontaneous activity at left cervical paraspinal muscles, left C5, 6 and 7  IMPRESSION:   This is a normal study. There is no electrodiagnostic evidence of left upper extremity neuropathy, brachial plexopathy, or left cervical radiculopathy, her history is however suggestive of cervical spine, left C5 root irritation, we will proceed with MRI of the cervical spine.    INTERPRETING PHYSICIAN:   Levert FeinsteinYan, Kasheem Toner M.D. Ph.D. [facility] [address], [address] [place], [place] [id number] [phone]

## 2013-03-15 ENCOUNTER — Ambulatory Visit (INDEPENDENT_AMBULATORY_CARE_PROVIDER_SITE_OTHER): Payer: BC Managed Care – PPO

## 2013-03-15 DIAGNOSIS — M79609 Pain in unspecified limb: Secondary | ICD-10-CM

## 2013-03-15 DIAGNOSIS — M79602 Pain in left arm: Secondary | ICD-10-CM

## 2013-03-20 NOTE — Progress Notes (Signed)
Quick Note:  Will review MRI film on Feb 19th 2015, visit ______

## 2013-03-22 ENCOUNTER — Encounter: Payer: Self-pay | Admitting: Neurology

## 2013-03-22 ENCOUNTER — Ambulatory Visit (INDEPENDENT_AMBULATORY_CARE_PROVIDER_SITE_OTHER): Payer: BC Managed Care – PPO | Admitting: Neurology

## 2013-03-22 VITALS — BP 107/72 | HR 70 | Ht 63.0 in | Wt 188.0 lb

## 2013-03-22 DIAGNOSIS — M79602 Pain in left arm: Secondary | ICD-10-CM

## 2013-03-22 DIAGNOSIS — M79609 Pain in unspecified limb: Secondary | ICD-10-CM

## 2013-03-22 DIAGNOSIS — N201 Calculus of ureter: Secondary | ICD-10-CM

## 2013-03-22 DIAGNOSIS — M5412 Radiculopathy, cervical region: Secondary | ICD-10-CM

## 2013-03-22 NOTE — Progress Notes (Signed)
PATIENT: Brittany RakesBarbara Hart DOB: 03/18/1980  HISTORICAL  Brittany Hart was initially referred by her primary care physician PA Carilyn GoodpastureJennifer Willard for electrodiagnostic study in February 5th 20/15, there was no significant abnormality found, but her history is suggestive of cervical radiculopathy, I have ordered MRI of cervical  She was previously healthy, in December 2014, without clear triggers, she had sudden onset shooting pain from her left neck to her left shoulder, initially she she thought she has slept wrong, but her symptoms gradually getting worse, especially when she flexes her neck, she has worsening tingling sensation around her left trapezius region, also shooting pain along left lateral arm to left elbow, and left dorsum hand involving left third to fifth fingers, for a while, whenever she sits down, she had shooting pain, gets relieved by standing up, lying down.  She was treated with steroid package, which has improved her symptoms, over the past 2 months, overall she reported 60-70% improvement, she denies significant weakness  She denies gait difficulty, no bowel bladder incontinence.  We reviewed MRI cervical together,leftward disc protrusion (5mm) with severe left foraminal stenosis and potential impingement upon the left C7 root  C7-T1: no spinal stenosis   REVIEW OF SYSTEMS: Full 14 system review of systems performed and notable only for left neck pain, shoulder pain  ALLERGIES: No Known Allergies  HOME MEDICATIONS: Cymbalta  PAST MEDICAL HISTORY: Past Medical History  Diagnosis Date  . Renal calculus or stone     PAST SURGICAL HISTORY: Past Surgical History  Procedure Laterality Date  . Ureteroscopy  06/04/2011    Procedure: URETEROSCOPY;  Surgeon: Garnett FarmMark C Ottelin, MD;  [facility];  Service: Urology;  Laterality: Left;  cystoscopy, Left ureteroscopy with lithotripsy holmium laser    FAMILY HISTORY: Family History  Problem Relation  Age of Onset  . Spondylitis Mother   . Bronchiolitis Mother   . Anxiety disorder Father   . Depression Father     SOCIAL HISTORY:     PHYSICAL EXAM   Filed Vitals:   03/22/13 0951  BP: 107/72  Pulse: 70  Height: 5\' 3"  (1.6 m)  Weight: 188 lb (85.276 kg)    Not recorded    Body mass index is 33.31 kg/(m^2).   Generalized: In no acute distress  Neck: Supple, no carotid bruits   Cardiac: Regular rate rhythm  Pulmonary: Clear to auscultation bilaterally  Musculoskeletal: No deformity  Neurological examination  Mentation: Alert oriented to time, place, history taking, and causual conversation  Cranial nerve II-XII: Pupils were equal round reactive to light. Extraocular movements were full.  Visual field were full on confrontational test. Bilateral fundi were sharp.  Facial sensation and strength were normal. Hearing was intact to finger rubbing bilaterally. Uvula tongue midline.  Head turning and shoulder shrug and were normal and symmetric.Tongue protrusion into cheek strength was normal.  Motor: Normal tone, bulk and strength, with exception of mild left arm pronation weakness.  Sensory: Intact to fine touch, pinprick, preserved vibratory sensation, and proprioception at toes.  Coordination: Normal finger to nose, heel-to-shin bilaterally there was no truncal ataxia  Gait: Rising up from seated position without assistance, normal stance, without trunk ataxia, moderate stride, good arm swing, smooth turning, able to perform tiptoe, and heel walking without difficulty.   Romberg signs: Negative  Deep tendon reflexes: Brachioradialis 2/2, biceps 2/2, triceps 2/2, patellar 2/2, Achilles 2/2, plantar responses were flexor bilaterally.   DIAGNOSTIC DATA (LABS, IMAGING, TESTING) - I reviewed patient records,  labs, notes, testing and imaging myself where available.  Lab Results  Component Value Date   WBC 6.4 05/17/2011   HGB 13.0 06/04/2011   HCT 38.3 05/17/2011    MCV 91.0 05/17/2011   PLT 250 05/17/2011      Component Value Date/Time   NA 137 05/17/2011 0103   K 3.3* 05/17/2011 0103   CL 100 05/17/2011 0103   CO2 28 05/17/2011 0103   GLUCOSE 135* 05/17/2011 0103   BUN 18 05/17/2011 0103   CREATININE 0.80 05/17/2011 0103   CALCIUM 9.4 05/17/2011 0103   GFRNONAA >90 05/17/2011 0103   GFRAA >90 05/17/2011 0103    ASSESSMENT AND PLAN  Brittany Hart is a 33 y.o. female complains of  left neck pain, radiating pain involving left third fourth and fifth fingers, mild left arm pronation weakness, MRI of cervical spine showed a left ward herniated disc, at C6-7 level,  with impingement of left C7 nerve roots,  Her symptoms overall has much improved, I have suggested moderate stretching exercise, as needed NSAIDs, return to clinic in 4-6 months   Levert Feinstein, M.D. Ph.D.  [facility] [address] [place], [place] [address] [phone]

## 2013-07-20 ENCOUNTER — Ambulatory Visit: Payer: BC Managed Care – PPO | Admitting: Neurology

## 2014-11-06 ENCOUNTER — Other Ambulatory Visit: Payer: Self-pay | Admitting: Family Medicine

## 2014-11-06 DIAGNOSIS — R1310 Dysphagia, unspecified: Secondary | ICD-10-CM

## 2016-10-13 ENCOUNTER — Encounter: Payer: Self-pay | Admitting: Obstetrics and Gynecology

## 2016-10-13 ENCOUNTER — Other Ambulatory Visit (HOSPITAL_COMMUNITY)
Admission: RE | Admit: 2016-10-13 | Discharge: 2016-10-13 | Disposition: A | Discharge status: Home / Self Care | Payer: Managed Care, Other (non HMO) | Source: Ambulatory Visit | Attending: Obstetrics and Gynecology | Admitting: Obstetrics and Gynecology

## 2016-10-13 ENCOUNTER — Ambulatory Visit (INDEPENDENT_AMBULATORY_CARE_PROVIDER_SITE_OTHER): Payer: Managed Care, Other (non HMO) | Admitting: Obstetrics and Gynecology

## 2016-10-13 VITALS — BP 114/68 | HR 88 | Resp 18 | Ht 63.0 in | Wt 169.0 lb

## 2016-10-13 DIAGNOSIS — Z0000 Encounter for general adult medical examination without abnormal findings: Secondary | ICD-10-CM | POA: Diagnosis not present

## 2016-10-13 DIAGNOSIS — Z01419 Encounter for gynecological examination (general) (routine) without abnormal findings: Secondary | ICD-10-CM | POA: Diagnosis not present

## 2016-10-13 DIAGNOSIS — N943 Premenstrual tension syndrome: Secondary | ICD-10-CM

## 2016-10-13 DIAGNOSIS — Z124 Encounter for screening for malignant neoplasm of cervix: Secondary | ICD-10-CM | POA: Diagnosis not present

## 2016-10-13 DIAGNOSIS — N920 Excessive and frequent menstruation with regular cycle: Secondary | ICD-10-CM | POA: Diagnosis not present

## 2016-10-13 DIAGNOSIS — Z803 Family history of malignant neoplasm of breast: Secondary | ICD-10-CM

## 2016-10-13 NOTE — Progress Notes (Signed)
36 y.o. [id number] MarriedCaucasianF here for annual exam.  Her period has gotten worse in the last year, getting heavier, some cramps. Mood changes prior to her cycle for one week.  Changing a super pad every 1-2 hours. She can saturate a super tampon in an hour. Not light headed, but tired and no energy. She occasionally has some blurry vision the week prior to her cycle, feels she needs to blink her eyes a few times, only lasts a few minutes. H/O a few migraines in high school, no further migraines, no auras.  Period Cycle (Days): 28 Period Duration (Days): 5 days  Period Pattern: Regular Menstrual Flow: Heavy Menstrual Control: Maxi pad Menstrual Control Change Freq (Hours): changes super pad every 1-2 hours Dysmenorrhea: None  Vasectomy for contraception. Sexually active, no pain.   Patient's last menstrual period was 09/19/2016.          Sexually active: Yes.    The current method of family planning is vasectomy.    Exercising: No.  The patient does not participate in regular exercise at present. Smoker:  no  Health Maintenance: Pap:  12-11-12 WNL History of abnormal Pap:  Yes 2000- repeated PAP- WNL  MMG:  never Colonoscopy:  never BMD:   never TDaP:  Thinks UTD Gardasil: N/A   reports that she has never smoked. She has never used smokeless tobacco. She reports that she does not drink alcohol or use drugs. She is a Administrator. Kids are 18 and 13. Son is in college and working. Plans to become an Art gallery manager. Younger son in 8th grade.   Past Medical History:  Diagnosis Date  . ADD (attention deficit disorder)   . Anxiety   . Renal calculus or stone    CURRENT    Past Surgical History:  Procedure Laterality Date  . URETEROSCOPY  06/04/2011   Procedure: URETEROSCOPY;  Surgeon: Garnett Farm, MD;  Location: [facility];  Service: Urology;  Laterality: Left;  cystoscopy, Left ureteroscopy with lithotripsy holmium laser    Current Outpatient Prescriptions   Medication Sig Dispense Refill  . amphetamine-dextroamphetamine (ADDERALL XR) 20 MG 24 hr capsule      No current facility-administered medications for this visit.     Family History  Problem Relation Age of Onset  . Spondylitis Mother   . Bronchiolitis Mother   . Anxiety disorder Father   . Depression Father   . Breast cancer Maternal Grandmother   . Hypertension Maternal Grandmother   . Breast cancer Paternal Grandmother   PGM double mastectomy in her 14's, MGM was PMP  Review of Systems  Constitutional: Negative.   HENT: Negative.   Eyes: Positive for visual disturbance.  Respiratory: Negative.   Cardiovascular: Negative.   Gastrointestinal: Negative.   Endocrine: Negative.   Genitourinary: Positive for menstrual problem.       Heavy menstrual bleeding   Musculoskeletal: Negative.   Skin: Negative.   Allergic/Immunologic: Negative.   Neurological: Negative.   Hematological: Negative.   Psychiatric/Behavioral: Negative.     Exam:   BP 114/68 (BP Location: Right Arm, Patient Position: Sitting, Cuff Size: Normal)   Pulse 88   Resp 18   Ht  (1.6 m)   Wt 169 lb (76.7 kg)   LMP 09/19/2016   BMI 29.94 kg/m   Weight change: @ Height:   Height:  (160 cm)  Ht Readings from Last 3 Encounters:  10/13/16  (1.6 m)  03/22/13  (1.6 m)  05/31/11  5\' 3"  (1.6 m)    General appearance: alert, cooperative and appears stated age Head: Normocephalic, without obvious abnormality, atraumatic Neck: no adenopathy, supple, symmetrical, trachea midline and thyroid normal to inspection and palpation Lungs: clear to auscultation bilaterally Cardiovascular: regular rate and rhythm Breasts: normal appearance, no masses or tenderness Abdomen: soft, non-tender; bowel sounds normal; no masses,  no organomegaly Extremities: extremities normal, atraumatic, no cyanosis or edema Skin: Skin color, texture, turgor normal. No rashes or lesions Lymph nodes:  Cervical, supraclavicular, and axillary nodes normal. No abnormal inguinal nodes palpated Neurologic: Grossly normal   Pelvic: External genitalia:  no lesions              Urethra:  normal appearing urethra with no masses, tenderness or lesions              Bartholins and Skenes: normal                 Vagina: normal appearing vagina with normal color and discharge, no lesions              Cervix: no lesions               Bimanual Exam:  Uterus:  normal size, contour, position, consistency, mobility, non-tender and anteverted, deviated to the patient's left              Adnexa: no mass, fullness, tenderness               Rectovaginal: Confirms               Anus:  normal sphincter tone, no lesions  Chaperone was present for exam.  A:  Well Woman with normal exam  Menorrhagia  PMS  Family history of breast cancer, PGM in her 3830's, MGM PMP  P:   Pap with hpv  CBC, TSH  Screening labs  If anemic would recommend ultrasound  Discussed options of OCP's or a mirena IUD to control cycles, information given

## 2016-10-13 NOTE — Patient Instructions (Addendum)
EXERCISE AND DIET:  We recommended that you start or continue a regular exercise program for good health. Regular exercise means any activity that makes your heart beat faster and makes you sweat.  We recommend exercising at least 30 minutes per day at least 3 days a week, preferably 4 or 5.  We also recommend a diet low in fat and sugar.  Inactivity, poor dietary choices and obesity can cause diabetes, heart attack, stroke, and kidney damage, among others.    ALCOHOL AND SMOKING:  Women should limit their alcohol intake to no more than 7 drinks/beers/glasses of wine (combined, not each!) per week. Moderation of alcohol intake to this level decreases your risk of breast cancer and liver damage. And of course, no recreational drugs are part of a healthy lifestyle.  And absolutely no smoking or even second hand smoke. Most people know smoking can cause heart and lung diseases, but did you know it also contributes to weakening of your bones? Aging of your skin?  Yellowing of your teeth and nails?  CALCIUM AND VITAMIN D:  Adequate intake of calcium and Vitamin D are recommended.  The recommendations for exact amounts of these supplements seem to change often, but generally speaking 600 mg of calcium (either carbonate or citrate) and 800 units of Vitamin D per day seems prudent. Certain women may benefit from higher intake of Vitamin D.  If you are among these women, your doctor will have told you during your visit.    PAP SMEARS:  Pap smears, to check for cervical cancer or precancers,  have traditionally been done yearly, although recent scientific advances have shown that most women can have pap smears less often.  However, every woman still should have a physical exam from her gynecologist every year. It will include a breast check, inspection of the vulva and vagina to check for abnormal growths or skin changes, a visual exam of the cervix, and then an exam to evaluate the size and shape of the uterus and  ovaries.  And after 36 years of age, a rectal exam is indicated to check for rectal cancers. We will also provide age appropriate advice regarding health maintenance, like when you should have certain vaccines, screening for sexually transmitted diseases, bone density testing, colonoscopy, mammograms, etc.   MAMMOGRAMS:  All women over 40 years old should have a yearly mammogram. Many facilities now offer a "3D" mammogram, which may cost around $50 extra out of pocket. If possible,  we recommend you accept the option to have the 3D mammogram performed.  It both reduces the number of women who will be called back for extra views which then turn out to be normal, and it is better than the routine mammogram at detecting truly abnormal areas.    COLONOSCOPY:  Colonoscopy to screen for colon cancer is recommended for all women at age 50.  We know, you hate the idea of the prep.  We agree, BUT, having colon cancer and not knowing it is worse!!  Colon cancer so often starts as a polyp that can be seen and removed at colonscopy, which can quite literally save your life!  And if your first colonoscopy is normal and you have no family history of colon cancer, most women don't have to have it again for 10 years.  Once every ten years, you can do something that may end up saving your life, right?  We will be happy to help you get it scheduled when you are ready.    Be sure to check your insurance coverage so you understand how much it will cost.  It may be covered as a preventative service at no cost, but you should check your particular policy.      Oral Contraception Information Oral contraceptive pills (OCPs) are medicines taken to prevent pregnancy. OCPs work by preventing the ovaries from releasing eggs. The hormones in OCPs also cause the cervical mucus to thicken, preventing the sperm from entering the uterus. The hormones also cause the uterine lining to become thin, not allowing a fertilized egg to attach to the  inside of the uterus. OCPs are highly effective when taken exactly as prescribed. However, OCPs do not prevent sexually transmitted diseases (STDs). Safe sex practices, such as using condoms along with the pill, can help prevent STDs. Before taking the pill, you may have a physical exam and Pap test. Your health care provider may order blood tests. The health care provider will make sure you are a good candidate for oral contraception. Discuss with your health care provider the possible side effects of the OCP you may be prescribed. When starting an OCP, it can take 2 to 3 months for the body to adjust to the changes in hormone levels in your body. Types of oral contraception  The combination pill-This pill contains estrogen and progestin (synthetic progesterone) hormones. The combination pill comes in 21-day, 28-day, or 91-day packs. Some types of combination pills are meant to be taken continuously (365-day pills). With 21-day packs, you do not take pills for 7 days after the last pill. With 28-day packs, the pill is taken every day. The last 7 pills are without hormones. Certain types of pills have more than 21 hormone-containing pills. With 91-day packs, the first 84 pills contain both hormones, and the last 7 pills contain no hormones or contain estrogen only.  The minipill-This pill contains the progesterone hormone only. The pill is taken every day continuously. It is very important to take the pill at the same time each day. The minipill comes in packs of 28 pills. All 28 pills contain the hormone. Advantages of oral contraceptive pills  Decreases premenstrual symptoms.  Treats menstrual period cramps.  Regulates the menstrual cycle.  Decreases a heavy menstrual flow.  May treatacne, depending on the type of pill.  Treats abnormal uterine bleeding.  Treats polycystic ovarian syndrome.  Treats endometriosis.  Can be used as emergency contraception. Things that can make oral  contraceptive pills less effective OCPs can be less effective if:  You forget to take the pill at the same time every day.  You have a stomach or intestinal disease that lessens the absorption of the pill.  You take OCPs with other medicines that make OCPs less effective, such as antibiotics, certain HIV medicines, and some seizure medicines.  You take expired OCPs.  You forget to restart the pill on day 7, when using the packs of 21 pills.  Risks associated with oral contraceptive pills Oral contraceptive pills can sometimes cause side effects, such as:  Headache.  Nausea.  Breast tenderness.  Irregular bleeding or spotting.  Combination pills are also associated with a small increased risk of:  Blood clots.  Heart attack.  Stroke.  This information is not intended to replace advice given to you by your health care provider. Make sure you discuss any questions you have with your health care provider. Document Released: 04/10/2002 Document Revised: 06/26/2015 Document Reviewed: 07/09/2012 Elsevier Interactive Patient Education  2018 Elsevier Inc.  Breast   Self-Awareness Breast self-awareness means being familiar with how your breasts look and feel. It involves checking your breasts regularly and reporting any changes to your health care provider. Practicing breast self-awareness is important. A change in your breasts can be a sign of a serious medical problem. Being familiar with how your breasts look and feel allows you to find any problems early, when treatment is more likely to be successful. All women should practice breast self-awareness, including women who have had breast implants. How to do a breast self-exam One way to learn what is normal for your breasts and whether your breasts are changing is to do a breast self-exam. To do a breast self-exam: Look for Changes  1. Remove all the clothing above your waist. 2. Stand in front of a mirror in a room with good  lighting. 3. Put your hands on your hips. 4. Push your hands firmly downward. 5. Compare your breasts in the mirror. Look for differences between them (asymmetry), such as: ? Differences in shape. ? Differences in size. ? Puckers, dips, and bumps in one breast and not the other. 6. Look at each breast for changes in your skin, such as: ? Redness. ? Scaly areas. 7. Look for changes in your nipples, such as: ? Discharge. ? Bleeding. ? Dimpling. ? Redness. ? A change in position. Feel for Changes  Carefully feel your breasts for lumps and changes. It is best to do this while lying on your back on the floor and again while sitting or standing in the shower or tub with soapy water on your skin. Feel each breast in the following way:  Place the arm on the side of the breast you are examining above your head.  Feel your breast with the other hand.  Start in the nipple area and make  inch (2 cm) overlapping circles to feel your breast. Use the pads of your three middle fingers to do this. Apply light pressure, then medium pressure, then firm pressure. The light pressure will allow you to feel the tissue closest to the skin. The medium pressure will allow you to feel the tissue that is a little deeper. The firm pressure will allow you to feel the tissue close to the ribs.  Continue the overlapping circles, moving downward over the breast until you feel your ribs below your breast.  Move one finger-width toward the center of the body. Continue to use the  inch (2 cm) overlapping circles to feel your breast as you move slowly up toward your collarbone.  Continue the up and down exam using all three pressures until you reach your armpit.  Write Down What You Find  Write down what is normal for each breast and any changes that you find. Keep a written record with breast changes or normal findings for each breast. By writing this information down, you do not need to depend only on memory for  size, tenderness, or location. Write down where you are in your menstrual cycle, if you are still menstruating. If you are having trouble noticing differences in your breasts, do not get discouraged. With time you will become more familiar with the variations in your breasts and more comfortable with the exam. How often should I examine my breasts? Examine your breasts every month. If you are breastfeeding, the best time to examine your breasts is after a feeding or after using a breast pump. If you menstruate, the best time to examine your breasts is 5-7 days after your   period is over. During your period, your breasts are lumpier, and it may be more difficult to notice changes. When should I see my health care provider? See your health care provider if you notice:  A change in shape or size of your breasts or nipples.  A change in the skin of your breast or nipples, such as a reddened or scaly area.  Unusual discharge from your nipples.  A lump or thick area that was not there before.  Pain in your breasts.  Anything that concerns you.  This information is not intended to replace advice given to you by your health care provider. Make sure you discuss any questions you have with your health care provider. Document Released: 01/18/2005 Document Revised: 06/26/2015 Document Reviewed: 12/08/2014 Elsevier Interactive Patient Education  2018 Elsevier Inc.  

## 2016-10-14 LAB — COMPREHENSIVE METABOLIC PANEL
ALT: 8 IU/L (ref 0–32)
AST: 14 IU/L (ref 0–40)
Albumin/Globulin Ratio: 1.6 (ref 1.2–2.2)
Albumin: 4.4 g/dL (ref 3.5–5.5)
Alkaline Phosphatase: 46 IU/L (ref 39–117)
BUN/Creatinine Ratio: 16 (ref 9–23)
BUN: 12 mg/dL (ref 6–20)
Bilirubin Total: 0.4 mg/dL (ref 0.0–1.2)
CO2: 25 mmol/L (ref 20–29)
CREATININE: 0.75 mg/dL (ref 0.57–1.00)
Calcium: 9.6 mg/dL (ref 8.7–10.2)
Chloride: 103 mmol/L (ref 96–106)
GFR calc Af Amer: 119 mL/min/{1.73_m2} (ref >59)
GFR, EST NON AFRICAN AMERICAN: 103 mL/min/{1.73_m2} (ref >59)
GLUCOSE: 82 mg/dL (ref 65–99)
Globulin, Total: 2.7 g/dL (ref 1.5–4.5)
Potassium: 4.4 mmol/L (ref 3.5–5.2)
Sodium: 142 mmol/L (ref 134–144)
Total Protein: 7.1 g/dL (ref 6.0–8.5)

## 2016-10-14 LAB — CBC
Hematocrit: 40.2 % (ref 34.0–46.6)
Hemoglobin: 13.1 g/dL (ref 11.1–15.9)
MCH: 29.8 pg (ref 26.6–33.0)
MCHC: 32.6 g/dL (ref 31.5–35.7)
MCV: 91 fL (ref 79–97)
Platelets: 257 10*3/uL (ref 150–379)
RBC: 4.4 x10E6/uL (ref 3.77–5.28)
RDW: 12.7 % (ref 12.3–15.4)
WBC: 6.1 10*3/uL (ref 3.4–10.8)

## 2016-10-14 LAB — LIPID PANEL
CHOL/HDL RATIO: 2.3 ratio (ref 0.0–4.4)
Cholesterol, Total: 167 mg/dL (ref 100–199)
HDL: 72 mg/dL (ref >39)
LDL CALC: 81 mg/dL (ref 0–99)
TRIGLYCERIDES: 68 mg/dL (ref 0–149)
VLDL CHOLESTEROL CAL: 14 mg/dL (ref 5–40)

## 2016-10-14 LAB — TSH: TSH: 2.92 u[IU]/mL (ref 0.450–4.500)

## 2016-10-14 LAB — FERRITIN: Ferritin: 15 ng/mL (ref 15–150)

## 2016-10-18 ENCOUNTER — Telehealth: Payer: Self-pay | Admitting: Obstetrics and Gynecology

## 2016-10-18 LAB — CYTOLOGY - PAP
DIAGNOSIS: NEGATIVE
HPV: NOT DETECTED

## 2016-10-18 NOTE — Telephone Encounter (Signed)
Patient requesting to speak to a nurse about restarting her birth control.

## 2016-10-19 MED ORDER — NORETHIN ACE-ETH ESTRAD-FE 1-20 MG-MCG PO TABS: 1.0000 | ORAL_TABLET | Freq: Every day | ORAL | 0 refills | Status: DC

## 2016-10-19 NOTE — Telephone Encounter (Signed)
Please let her know that I have called in 3 months of OCP's for her. Start on the first day of her cycle. I would like to see her in 3 months for a pill check. We discussed the risks of the pill at her visit.

## 2016-10-19 NOTE — Telephone Encounter (Signed)
Spoke with patient, advised as seen below per Dr. Reyne Dumas. Confirmed with Dr. Oscar La ok to start OCP now, day 3 of menses. Patient scheduled for 01/18/17 at 3:15pm for f/u. Patient verbalizes understanding and is agreeable.   Patient is agreeable to disposition. Will close encounter.

## 2016-10-19 NOTE — Telephone Encounter (Signed)
Left message to call Ingris Pasquarella at [phone].  

## 2016-10-19 NOTE — Telephone Encounter (Signed)
Spoke with patient, would like to start OCP as previously discussed at 10/13/16 AEX. LMP 10/16/16, discussed starting OCP with menses, use BUM for 7 days. Can take 3 months for cycles to adjust to new OCP.  [facility].   Patient states she was recommended to increase dietary iron for low ferritin, would prefer supplement, recommendations?  Advised patient would review with Dr. Oscar La and return call with recommendations, patient request to leave detailed message if no answer.   Dr. Oscar La- please advise on RX for OCP and iron supplement?

## 2016-10-19 NOTE — Telephone Encounter (Signed)
Patient returning your call.

## 2016-12-02 ENCOUNTER — Telehealth: Payer: Self-pay | Admitting: Genetic Counselor

## 2016-12-02 NOTE — Telephone Encounter (Signed)
LM on VM that I called [facility] on referral from Dr. Oscar LaJertson.  Asked that she CB.

## 2016-12-04 ENCOUNTER — Telehealth: Payer: Self-pay | Admitting: Genetics

## 2016-12-04 NOTE — Telephone Encounter (Signed)
Scheduled appt per referral request from Dr. Kristie CowmanJertsen - patient is aware of appt date and time.

## 2017-01-03 ENCOUNTER — Encounter: Payer: Managed Care, Other (non HMO) | Admitting: Genetics

## 2017-01-10 ENCOUNTER — Other Ambulatory Visit: Payer: Self-pay | Admitting: Obstetrics and Gynecology

## 2017-01-11 NOTE — Telephone Encounter (Signed)
One month refill sent.

## 2017-01-11 NOTE — Telephone Encounter (Signed)
Left detailed message, ok per current dpr. Advised request refill filled for 1 month supply, keep upcoming OV for 01/18/17 for future refills. Return call to office at [phone] for any additional questions.   Will close encounter.

## 2017-01-11 NOTE — Telephone Encounter (Signed)
Medication refill request: OCP Last AEX:  10/13/16 JJ  Next OV: 01/18/17  Last MMG (if hormonal medication request): n/a Refill authorized: 10/19/16 #3 package, 0RF. Today, please advise.

## 2017-01-18 ENCOUNTER — Other Ambulatory Visit: Payer: Self-pay

## 2017-01-18 ENCOUNTER — Encounter: Payer: Self-pay | Admitting: Obstetrics and Gynecology

## 2017-01-18 ENCOUNTER — Ambulatory Visit: Payer: Managed Care, Other (non HMO) | Admitting: Obstetrics and Gynecology

## 2017-01-18 VITALS — BP 118/70 | HR 88 | Resp 14 | Wt 176.0 lb

## 2017-01-18 DIAGNOSIS — Z3041 Encounter for surveillance of contraceptive pills: Secondary | ICD-10-CM

## 2017-01-18 DIAGNOSIS — R635 Abnormal weight gain: Secondary | ICD-10-CM | POA: Diagnosis not present

## 2017-01-18 DIAGNOSIS — N920 Excessive and frequent menstruation with regular cycle: Secondary | ICD-10-CM

## 2017-01-18 MED ORDER — NORETHIN ACE-ETH ESTRAD-FE 1-20 MG-MCG PO TABS: 1.0000 | ORAL_TABLET | Freq: Every day | ORAL | 2 refills | Status: DC

## 2017-01-18 NOTE — Progress Notes (Signed)
GYNECOLOGY  VISIT   HPI: 36 y.o.   Married  Caucasian  female   G2P2002 with Patient's last menstrual period was 01/03/2017.   here for follow up on OCP. She was started on OCP's in 9/18 for menorrhagia. Normal TSH, not anemic but low normal ferritin. Since starting the pill her cycles have gotten so much better. This last cycle is was very light. Some BTB after her first cycle, none since then. No cramps.  No side effects. She has an increased appetite prior to her cycle, a little better on the pill. She will f/u with her primary. She has been under some increased stress. Her Grandmother died last weekend.  In the past she lost 100 lbs on her own. She is an Surveyor, quantityemotional eater.       GYNECOLOGIC HISTORY: Patient's last menstrual period was 01/03/2017. Contraception:OCP, vasecomy.  Menopausal hormone therapy: none         OB History    Gravida Para Term Preterm AB Living   2 2 2     2    SAB TAB Ectopic Multiple Live Births           2         Patient Active Problem List   Diagnosis Date Noted  . Left cervical radiculopathy 03/22/2013  . Left arm pain 03/08/2013  . Ureteral calculus, left 06/04/2011    Class: Diagnosis of    Past Medical History:  Diagnosis Date  . ADD (attention deficit disorder)   . Anxiety   . Renal calculus or stone    CURRENT    Past Surgical History:  Procedure Laterality Date  . URETEROSCOPY  06/04/2011   Procedure: URETEROSCOPY;  Surgeon: Garnett FarmMark C Ottelin, MD;  [facility];  Service: Urology;  Laterality: Left;  cystoscopy, Left ureteroscopy with lithotripsy holmium laser    Current Outpatient Medications  Medication Sig Dispense Refill  . amphetamine-dextroamphetamine (ADDERALL XR) 20 MG 24 hr capsule     . JUNEL FE 1/20 1-20 MG-MCG tablet TAKE 1 TABLET BY MOUTH EVERY DAY 28 tablet 0   No current facility-administered medications for this visit.      ALLERGIES: Patient has no known allergies.  Family History  Problem  Relation Age of Onset  . Spondylitis Mother   . Bronchiolitis Mother   . Anxiety disorder Father   . Depression Father   . Breast cancer Maternal Grandmother   . Hypertension Maternal Grandmother   . Breast cancer Paternal Grandmother     Social History   Socioeconomic History  . Marital status: Married    Spouse name: Micheal  . Number of children: 2  . Years of education: 414  . Highest education level: Not on file  Social Needs  . Financial resource strain: Not on file  . Food insecurity - worry: Not on file  . Food insecurity - inability: Not on file  . Transportation needs - medical: Not on file  . Transportation needs - non-medical: Not on file  Occupational History    Comment: Home Maker  Tobacco Use  . Smoking status: Never Smoker  . Smokeless tobacco: Never Used  Substance and Sexual Activity  . Alcohol use: No  . Drug use: No  . Sexual activity: Yes    Partners: Male    Comment: Huband had vasectomy  Other Topics Concern  . Not on file  Social History Narrative   Patient lives at home with her husband [name](Micheal)   Patient is  a homemaker   Right handed   Education two years of college   Caffeine None    Review of Systems  Constitutional: Negative.   HENT: Negative.   Eyes: Negative.   Respiratory: Negative.   Cardiovascular: Negative.   Gastrointestinal: Negative.   Genitourinary: Negative.   Musculoskeletal: Negative.   Skin: Negative.   Neurological: Negative.   Endo/Heme/Allergies: Negative.   Psychiatric/Behavioral: Negative.     PHYSICAL EXAMINATION:    BP 118/70 (BP Location: Right Arm, Patient Position: Sitting, Cuff Size: Normal)   Pulse 88   Resp 14   Wt 176 lb (79.8 kg)   LMP 01/03/2017   BMI 31.18 kg/m     General appearance: alert, cooperative and appears stated age  ASSESSMENT Menorrhagia, so much better on OCP's Weight gain started prior to OCP's, has had an increase in appetite for many months.     PLAN Will continue  OCP's F/U for her annual next fall Will f/u with her primary to further discuss weight loss. She has lost 100 lbs on her own in the past.  Discussed exercise eating healthy. Discussed weighs to control her portion.    An After Visit Summary was printed and given to the patient.

## 2017-10-13 ENCOUNTER — Telehealth: Payer: Self-pay | Admitting: *Deleted

## 2017-10-13 MED ORDER — NORETHIN ACE-ETH ESTRAD-FE 1-20 MG-MCG PO TABS: 1.0000 | ORAL_TABLET | Freq: Every day | ORAL | 0 refills | Status: DC

## 2017-10-13 NOTE — Progress Notes (Signed)
37 y.o. 702P2002 Married White or Caucasian Not Hispanic or Latino female here for annual exam.  She was started on OCP's last year for menorrhagia, with excellent results.  Period Cycle (Days): 28 Period Duration (Days): 4 days  Period Pattern: Regular Menstrual Flow: Light Menstrual Control: Thin pad, Tampon Menstrual Control Change Freq (Hours): changes pad every 8 hours  Dysmenorrhea: None, she can have mild cramps prior to her cycle. No dyspareunia.   She c/o feeling sluggish, memory loss, issues with focus. Some hair loss, no change. She c/o excessive thirst  Patient's last menstrual period was 10/07/2017.          Sexually active: Yes.    The current method of family planning is vasectomy and OCP (estrogen/progesterone).    Exercising: No.  The patient does not participate in regular exercise at present. Smoker:  no  Health Maintenance: Pap:  10/13/2016 WNL NEG HR HPV 12-11-12 WNL History of abnormal Pap:  Yes in 2000 MMG:  n/a BMD:   n/a Colonoscopy: n/a TDaP:  Unsure, she will check with her primary Gardasil: no    reports that she has never smoked. She has never used smokeless tobacco. She reports that she does not drink alcohol or use drugs. 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 Son is in college and working. Plans to become an Art gallery managerngineer. Younger son in 9th grade.   Past Medical History:  Diagnosis Date  . ADD (attention deficit disorder)   . Anxiety   . Renal calculus or stone    CURRENT    Past Surgical History:  Procedure Laterality Date  . ESOPHAGEAL DILATION    . URETEROSCOPY  06/04/2011   Procedure: URETEROSCOPY;  Surgeon: Garnett FarmMark C Ottelin, MD;  [facility];  Service: Urology;  Laterality: Left;  cystoscopy, Left ureteroscopy with lithotripsy holmium laser    Current Outpatient Medications  Medication Sig Dispense Refill  . amphetamine-dextroamphetamine (ADDERALL XR) 20 MG 24 hr capsule     . norethindrone-ethinyl estradiol  (JUNEL FE 1/20) 1-20 MG-MCG tablet Take 1 tablet by mouth daily. 28 tablet 0   No current facility-administered medications for this visit.     Family History  Problem Relation Age of Onset  . Spondylitis Mother   . Bronchiolitis Mother   . Anxiety disorder Father   . Depression Father   . Breast cancer Maternal Grandmother   . Hypertension Maternal Grandmother   . Breast cancer Paternal Grandmother     Review of Systems  Constitutional: Negative.   HENT: Negative.   Eyes: Negative.   Respiratory: Negative.   Cardiovascular: Negative.   Gastrointestinal: Negative.   Endocrine: Negative.   Genitourinary: Negative.   Musculoskeletal: Negative.   Skin: Negative.   Allergic/Immunologic: Negative.   Neurological: Negative.   Hematological: Negative.   Psychiatric/Behavioral: Negative.   All other systems reviewed and are negative.   Exam:   BP 100/60 (BP Location: Right Arm, Patient Position: Sitting, Cuff Size: Normal)   Pulse 84   Resp 14   Ht 5\' 3"  (1.6 m)   Wt 169 lb (76.7 kg)   LMP 10/07/2017   BMI 29.94 kg/m   Weight change: @WEIGHTCHANGE @ Height:   Height: 5\' 3"  (160 cm)  Ht Readings from Last 3 Encounters:  10/17/17 5\' 3"  (1.6 m)  10/13/16 5\' 3"  (1.6 m)  03/22/13 5\' 3"  (1.6 m)    General appearance: alert, cooperative and appears stated age Head: Normocephalic, without obvious abnormality, atraumatic Neck: no  adenopathy, supple, symmetrical, trachea midline and thyroid normal to inspection and palpation Lungs: clear to auscultation bilaterally Cardiovascular: regular rate and rhythm Breasts: normal appearance, no masses or tenderness Abdomen: soft, non-tender; non distended,  no masses,  no organomegaly Extremities: extremities normal, atraumatic, no cyanosis or edema Skin: Skin color, texture, turgor normal. No rashes or lesions Lymph nodes: Cervical, supraclavicular, and axillary nodes normal. No abnormal inguinal nodes palpated Neurologic: Grossly  normal   Pelvic: External genitalia:  no lesions              Urethra:  normal appearing urethra with no masses, tenderness or lesions              Bartholins and Skenes: normal                 Vagina: normal appearing vagina with normal color and discharge, no lesions              Cervix: no lesions               Bimanual Exam:  Uterus:  normal size, contour, position, consistency, mobility, non-tender              Adnexa: no mass, fullness, tenderness               Rectovaginal: Confirms               Anus:  normal sphincter tone, no lesions  Chaperone was present for exam.  A:  Well Woman with normal exam  Fatigue  H/O anemia  H/O menorrhagia, controlled on OCP's  Polydipsia  P:   No pap this year  Continue OCP's  Screening labs, TSH, HgbA1C  Discussed breast self exam  Discussed calcium and vit D intake

## 2017-10-13 NOTE — Telephone Encounter (Signed)
[facility].   [facility] [phone].   Need patient to advise if she has enough medication to last until appointment on 10-17-17 with Dr. Oscar LaJertson.

## 2017-10-13 NOTE — Telephone Encounter (Signed)
Medication refill request: OCP Last AEX:  10-13-16 JJ  Next AEX: 10-17-17  Last MMG (if hormonal medication request): n/a Refill authorized: 01-18-17 #84, 2 RF. Today, please advise.   Patient returned call. Patient states she thinks she is due to start a new pack on Sunday, so will need refilled prior to her appointment. Advised would send request to Dr. Oscar LaJertson.   Medication pended for #28 as patient has aex on Monday.

## 2017-10-17 ENCOUNTER — Encounter: Payer: Self-pay | Admitting: Obstetrics and Gynecology

## 2017-10-17 ENCOUNTER — Other Ambulatory Visit: Payer: Self-pay

## 2017-10-17 ENCOUNTER — Ambulatory Visit: Payer: Managed Care, Other (non HMO) | Admitting: Obstetrics and Gynecology

## 2017-10-17 VITALS — BP 100/60 | HR 84 | Resp 14 | Ht 63.0 in | Wt 169.0 lb

## 2017-10-17 DIAGNOSIS — R5383 Other fatigue: Secondary | ICD-10-CM | POA: Diagnosis not present

## 2017-10-17 DIAGNOSIS — R631 Polydipsia: Secondary | ICD-10-CM

## 2017-10-17 DIAGNOSIS — Z862 Personal history of diseases of the blood and blood-forming organs and certain disorders involving the immune mechanism: Secondary | ICD-10-CM

## 2017-10-17 DIAGNOSIS — Z01419 Encounter for gynecological examination (general) (routine) without abnormal findings: Secondary | ICD-10-CM | POA: Diagnosis not present

## 2017-10-17 DIAGNOSIS — Z0000 Encounter for general adult medical examination without abnormal findings: Secondary | ICD-10-CM

## 2017-10-17 MED ORDER — NORETHIN ACE-ETH ESTRAD-FE 1-20 MG-MCG PO TABS: 1.0000 | ORAL_TABLET | Freq: Every day | ORAL | 3 refills | Status: DC

## 2017-10-17 NOTE — Patient Instructions (Signed)
EXERCISE AND DIET:  We recommended that you start or continue a regular exercise program for good health. Regular exercise means any activity that makes your heart beat faster and makes you sweat.  We recommend exercising at least 30 minutes per day at least 3 days a week, preferably 4 or 5.  We also recommend a diet low in fat and sugar.  Inactivity, poor dietary choices and obesity can cause diabetes, heart attack, stroke, and kidney damage, among others.    ALCOHOL AND SMOKING:  Women should limit their alcohol intake to no more than 7 drinks/beers/glasses of wine (combined, not each!) per week. Moderation of alcohol intake to this level decreases your risk of breast cancer and liver damage. And of course, no recreational drugs are part of a healthy lifestyle.  And absolutely no smoking or even second hand smoke. Most people know smoking can cause heart and lung diseases, but did you know it also contributes to weakening of your bones? Aging of your skin?  Yellowing of your teeth and nails?  CALCIUM AND VITAMIN D:  Adequate intake of calcium and Vitamin D are recommended.  The recommendations for exact amounts of these supplements seem to change often, but generally speaking 600 mg of calcium (either carbonate or citrate) and 800 units of Vitamin D per day seems prudent. Certain women may benefit from higher intake of Vitamin D.  If you are among these women, your doctor will have told you during your visit.    PAP SMEARS:  Pap smears, to check for cervical cancer or precancers,  have traditionally been done yearly, although recent scientific advances have shown that most women can have pap smears less often.  However, every woman still should have a physical exam from her gynecologist every year. It will include a breast check, inspection of the vulva and vagina to check for abnormal growths or skin changes, a visual exam of the cervix, and then an exam to evaluate the size and shape of the uterus and  ovaries.  And after 37 years of age, a rectal exam is indicated to check for rectal cancers. We will also provide age appropriate advice regarding health maintenance, like when you should have certain vaccines, screening for sexually transmitted diseases, bone density testing, colonoscopy, mammograms, etc.   MAMMOGRAMS:  All women over 40 years old should have a yearly mammogram. Many facilities now offer a "3D" mammogram, which may cost around $50 extra out of pocket. If possible,  we recommend you accept the option to have the 3D mammogram performed.  It both reduces the number of women who will be called back for extra views which then turn out to be normal, and it is better than the routine mammogram at detecting truly abnormal areas.    COLONOSCOPY:  Colonoscopy to screen for colon cancer is recommended for all women at age 50.  We know, you hate the idea of the prep.  We agree, BUT, having colon cancer and not knowing it is worse!!  Colon cancer so often starts as a polyp that can be seen and removed at colonscopy, which can quite literally save your life!  And if your first colonoscopy is normal and you have no family history of colon cancer, most women don't have to have it again for 10 years.  Once every ten years, you can do something that may end up saving your life, right?  We will be happy to help you get it scheduled when you are ready.    Be sure to check your insurance coverage so you understand how much it will cost.  It may be covered as a preventative service at no cost, but you should check your particular policy.      Breast Self-Awareness Breast self-awareness means being familiar with how your breasts look and feel. It involves checking your breasts regularly and reporting any changes to your health care provider. Practicing breast self-awareness is important. A change in your breasts can be a sign of a serious medical problem. Being familiar with how your breasts look and feel allows  you to find any problems early, when treatment is more likely to be successful. All women should practice breast self-awareness, including women who have had breast implants. How to do a breast self-exam One way to learn what is normal for your breasts and whether your breasts are changing is to do a breast self-exam. To do a breast self-exam: Look for Changes  1. Remove all the clothing above your waist. 2. Stand in front of a mirror in a room with good lighting. 3. Put your hands on your hips. 4. Push your hands firmly downward. 5. Compare your breasts in the mirror. Look for differences between them (asymmetry), such as: ? Differences in shape. ? Differences in size. ? Puckers, dips, and bumps in one breast and not the other. 6. Look at each breast for changes in your skin, such as: ? Redness. ? Scaly areas. 7. Look for changes in your nipples, such as: ? Discharge. ? Bleeding. ? Dimpling. ? Redness. ? A change in position. Feel for Changes  Carefully feel your breasts for lumps and changes. It is best to do this while lying on your back on the floor and again while sitting or standing in the shower or tub with soapy water on your skin. Feel each breast in the following way:  Place the arm on the side of the breast you are examining above your head.  Feel your breast with the other hand.  Start in the nipple area and make  inch (2 cm) overlapping circles to feel your breast. Use the pads of your three middle fingers to do this. Apply light pressure, then medium pressure, then firm pressure. The light pressure will allow you to feel the tissue closest to the skin. The medium pressure will allow you to feel the tissue that is a little deeper. The firm pressure will allow you to feel the tissue close to the ribs.  Continue the overlapping circles, moving downward over the breast until you feel your ribs below your breast.  Move one finger-width toward the center of the body.  Continue to use the  inch (2 cm) overlapping circles to feel your breast as you move slowly up toward your collarbone.  Continue the up and down exam using all three pressures until you reach your armpit.  Write Down What You Find  Write down what is normal for each breast and any changes that you find. Keep a written record with breast changes or normal findings for each breast. By writing this information down, you do not need to depend only on memory for size, tenderness, or location. Write down where you are in your menstrual cycle, if you are still menstruating. If you are having trouble noticing differences in your breasts, do not get discouraged. With time you will become more familiar with the variations in your breasts and more comfortable with the exam. How often should I examine my breasts? Examine   your breasts every month. If you are breastfeeding, the best time to examine your breasts is after a feeding or after using a breast pump. If you menstruate, the best time to examine your breasts is 5-7 days after your period is over. During your period, your breasts are lumpier, and it may be more difficult to notice changes. When should I see my health care provider? See your health care provider if you notice:  A change in shape or size of your breasts or nipples.  A change in the skin of your breast or nipples, such as a reddened or scaly area.  Unusual discharge from your nipples.  A lump or thick area that was not there before.  Pain in your breasts.  Anything that concerns you.  This information is not intended to replace advice given to you by your health care provider. Make sure you discuss any questions you have with your health care provider. Document Released: 01/18/2005 Document Revised: 06/26/2015 Document Reviewed: 12/08/2014 Elsevier Interactive Patient Education  2018 Elsevier Inc.  

## 2017-10-18 LAB — COMPREHENSIVE METABOLIC PANEL
A/G RATIO: 1.8 (ref 1.2–2.2)
ALT: 12 IU/L (ref 0–32)
AST: 17 IU/L (ref 0–40)
Albumin: 4.5 g/dL (ref 3.5–5.5)
Alkaline Phosphatase: 47 IU/L (ref 39–117)
BUN/Creatinine Ratio: 19 (ref 9–23)
BUN: 17 mg/dL (ref 6–20)
Bilirubin Total: 0.7 mg/dL (ref 0.0–1.2)
CO2: 24 mmol/L (ref 20–29)
Calcium: 9.3 mg/dL (ref 8.7–10.2)
Chloride: 99 mmol/L (ref 96–106)
Creatinine, Ser: 0.88 mg/dL (ref 0.57–1.00)
GFR, EST AFRICAN AMERICAN: 97 mL/min/{1.73_m2} (ref >59)
GFR, EST NON AFRICAN AMERICAN: 84 mL/min/{1.73_m2} (ref >59)
GLOBULIN, TOTAL: 2.5 g/dL (ref 1.5–4.5)
Glucose: 76 mg/dL (ref 65–99)
POTASSIUM: 4.7 mmol/L (ref 3.5–5.2)
SODIUM: 137 mmol/L (ref 134–144)
TOTAL PROTEIN: 7 g/dL (ref 6.0–8.5)

## 2017-10-18 LAB — CBC
Hematocrit: 41.1 % (ref 34.0–46.6)
Hemoglobin: 14.3 g/dL (ref 11.1–15.9)
MCH: 31.7 pg (ref 26.6–33.0)
MCHC: 34.8 g/dL (ref 31.5–35.7)
MCV: 91 fL (ref 79–97)
PLATELETS: 294 10*3/uL (ref 150–450)
RBC: 4.51 x10E6/uL (ref 3.77–5.28)
RDW: 11.6 % — AB (ref 12.3–15.4)
WBC: 7.1 10*3/uL (ref 3.4–10.8)

## 2017-10-18 LAB — LIPID PANEL
CHOLESTEROL TOTAL: 191 mg/dL (ref 100–199)
Chol/HDL Ratio: 2.7 ratio (ref 0.0–4.4)
HDL: 70 mg/dL (ref >39)
LDL Calculated: 107 mg/dL — ABNORMAL HIGH (ref 0–99)
Triglycerides: 69 mg/dL (ref 0–149)
VLDL CHOLESTEROL CAL: 14 mg/dL (ref 5–40)

## 2017-10-18 LAB — HEMOGLOBIN A1C
ESTIMATED AVERAGE GLUCOSE: 100 mg/dL
Hgb A1c MFr Bld: 5.1 % (ref 4.8–5.6)

## 2017-10-18 LAB — TSH: TSH: 2.9 u[IU]/mL (ref 0.450–4.500)

## 2017-10-20 ENCOUNTER — Telehealth: Payer: Self-pay

## 2017-10-20 NOTE — Telephone Encounter (Signed)
-----   Message from Romualdo BolkJill Evelyn Jertson, MD sent at 10/19/2017  5:19 PM EDT ----- [id number] is also normal. Please inform

## 2017-10-20 NOTE — Telephone Encounter (Signed)
Left message informing patient of normal lab results 

## 2018-05-01 ENCOUNTER — Encounter: Payer: Self-pay | Admitting: Physician Assistant

## 2018-05-01 ENCOUNTER — Ambulatory Visit (INDEPENDENT_AMBULATORY_CARE_PROVIDER_SITE_OTHER): Payer: 59 | Admitting: Physician Assistant

## 2018-05-01 DIAGNOSIS — F900 Attention-deficit hyperactivity disorder, predominantly inattentive type: Secondary | ICD-10-CM

## 2018-05-01 MED ORDER — AMPHETAMINE-DEXTROAMPHET ER 20 MG PO CP24: 20.0000 mg | ORAL_CAPSULE | Freq: Every day | ORAL | 0 refills | Status: DC

## 2018-05-01 NOTE — Progress Notes (Signed)
[facility] MD/PA/NP Initial Note  05/01/2018 5:42 PM Brittany Hart  MRN:  [id number]  Chief Complaint:  Chief Complaint    ADHD     Virtual Visit via Telephone Note  I connected with Brittany Hart on 05/01/18 at  3:30 PM EDT by telephone and verified that I am speaking with the correct person using two identifiers.   I discussed the limitations, risks, security and privacy concerns of performing an evaluation and management service by telephone and the availability of in person appointments. I also discussed with the patient that there may be a patient responsible charge related to this service. The patient expressed understanding and agreed to proceed.     HPI: Has seen Dr. Marisue Hart at [facility] for a while.  She is transferring her care to our office for convenience and the fact that her son is a patient of Brittany Hart.  She has been on Adderall for ADD for a while now, several years.  She is always had trouble with easy distractibility, difficulty staying on task and completing things.  Was never diagnosed as a child but looking back, she did have problems with her focus.  She had official testing done through [facility].  Being on the Adderall has helped her greatly.  She used to have a lot of anxiety as well but that is much better since being on the Adderall.  The ability to concentrate and finish things without so many distractions has helped relieve some of the anxiety that she has felt.  Patient denies loss of interest in usual activities and is able to enjoy things.  Denies decreased energy or motivation.  Appetite has not changed.  No extreme sadness, tearfulness, or feelings of hopelessness.  She sleeps well most of the time.  Denies suicidal or homicidal thoughts.  Patient denies increased energy with decreased need for sleep, no increased talkativeness, no racing thoughts, no impulsivity or risky behaviors, no increased  spending, no increased libido, no grandiosity.  Visit Diagnosis:    ICD-10-CM   1. Attention deficit hyperactivity disorder (ADHD), predominantly inattentive type F90.0     Past Psychiatric History: no h/o cutting/burning. Has never been hospitalized for any psych reasons.  No history of suicide attempt.  Past medications for mental health diagnoses include: none   Past Medical History:  Past Medical History:  Diagnosis Date  . ADD (attention deficit disorder)   . Anxiety   . Kidney stone   . Renal calculus or stone    CURRENT    Past Surgical History:  Procedure Laterality Date  . ESOPHAGEAL DILATION    . URETEROSCOPY  06/04/2011   Procedure: URETEROSCOPY;  Surgeon: Garnett Farm, MD;  Location: [facility];  Service: Urology;  Laterality: Left;  cystoscopy, Left ureteroscopy with lithotripsy holmium laser    Family Psychiatric History: See below  Family History:  Family History  Problem Relation Age of Onset  . Spondylitis Mother   . Bronchiolitis Mother   . Depression Mother   . Anxiety disorder Father   . Depression Father   . GER disease Father   . Seizures Father   . Breast cancer Maternal Grandmother   . Hypertension Maternal Grandmother   . Depression Maternal Grandmother   . Anxiety disorder Maternal Grandmother   . Dementia Maternal Grandmother   . Breast cancer Paternal Grandmother   . Kidney cancer Maternal Grandfather   . CVA Paternal Grandfather   . ADD / ADHD Son   .  Healthy Son     Social History:  Social History   Socioeconomic History  . Marital status: Married    Spouse name: Brittany Hart  . Number of children: 2  . Years of education: 82  . Highest education level: Associate degree: occupational, Scientist, product/process development, or vocational program  Occupational History  . Occupation: preschool    Comment: 38 yo  Social Needs  . Financial resource strain: Not hard at all  . Food insecurity:    Worry: Never true    Inability: Never true  .  Transportation needs:    Medical: No    Non-medical: No  Tobacco Use  . Smoking status: Never Smoker  . Smokeless tobacco: Never Used  Substance and Sexual Activity  . Alcohol use: Yes    Comment: 1 per year  . Drug use: No  . Sexual activity: Yes    Partners: Male    Comment: Huband had vasectomy  Lifestyle  . Physical activity:    Days per week: 0 days    Minutes per session: 0 min  . Stress: Not on file  Relationships  . Social connections:    Talks on phone: Twice a week    Gets together: Twice a week    Attends religious service: Never    Active member of club or organization: Yes    Attends meetings of clubs or organizations: More than 4 times per year    Relationship status: Married  Other Topics Concern  . 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   Grew up in [place]. Only child.  Both bio parents in home. Mom was homemaker.  Dad was Psychologist, occupational.    No religious beliefs.   No legal.     Allergies: No Known Allergies  Metabolic Disorder Labs: Lab Results  Component Value Date   HGBA1C 5.1 10/17/2017   No results found for: PROLACTIN Lab Results  Component Value Date   CHOL 191 10/17/2017   TRIG 69 10/17/2017   HDL 70 10/17/2017   CHOLHDL 2.7 10/17/2017   LDLCALC 107 (H) 10/17/2017   LDLCALC 81 10/13/2016   Lab Results  Component Value Date   TSH 2.900 10/17/2017   TSH 2.920 10/13/2016    Therapeutic Level Labs: No results found for: LITHIUM No results found for: VALPROATE No components found for:  CBMZ  Current Medications: Current Outpatient Medications  Medication Sig Dispense Refill  . amphetamine-dextroamphetamine (ADDERALL XR) 20 MG 24 hr capsule     . norethindrone-ethinyl estradiol (JUNEL FE 1/20) 1-20 MG-MCG tablet Take 1 tablet by mouth daily. 3 Package 3  .  omeprazole (PRILOSEC) 40 MG capsule TAKE 1 CAPSULE BY MOUTH BEFORE MORNING MEAL EVERY DAY    . SUMAtriptan (IMITREX) 50 MG tablet TAKE 1 TABLET BY MOUTH ONCE WHEN HEADACHE STARTS, OK TO REPEAT AFTER 2 HOURS    . amphetamine-dextroamphetamine (ADDERALL XR) 20 MG 24 hr capsule Take 1 capsule (20 mg total) by mouth daily. 30 capsule 0   No current facility-administered medications for this visit.     Medication Side Effects: none  Orders placed this visit:  No orders of the defined types were placed in this encounter.   Psychiatric Specialty Exam:  Review of Systems  Constitutional: Negative.  HENT: Negative.   Eyes: Negative.   Respiratory: Negative.   Cardiovascular: Negative.   Gastrointestinal: Positive for heartburn.  Genitourinary: Negative.   Musculoskeletal: Negative.   Skin: Negative.   Neurological: Negative.   Endo/Heme/Allergies: Negative.   Psychiatric/Behavioral: Negative.     There were no vitals taken for this visit.There is no height or weight on file to calculate BMI.  General Appearance: phone visit.  unable to assess  Eye Contact:  unable to assess  Speech:  Clear and Coherent  Volume:  Normal  Mood:  Euthymic  Affect:  unable to assess  Thought Process:  Goal Directed  Orientation:  Full (Time, Place, and Person)  Thought Content: Logical   Suicidal Thoughts:  No  Homicidal Thoughts:  No  Memory:  WNL  Judgement:  Good  Insight:  Good  Psychomotor Activity:  unable to assess  Concentration:  Concentration: Good and Attention Span: Good  Recall:  Good  Fund of Knowledge: Good  Language: Good  Assets:  Desire for Improvement  ADL's:  Intact  Cognition: WNL  Prognosis:  Good   Screenings:   Receiving Psychotherapy: No   Treatment Plan/Recommendations: Because of the coronavirus pandemic and the fact that I am not seeing her face-to-face, I am unable to obtain vital signs.  Patient tells me she has never had trouble with blood pressure  issues.  I have reviewed the PDMP which shows she has been receiving Adderall XR 20 mg monthly, therefore I will continue to prescribe this for her.  For now, I will give her 2 separate prescriptions and when we are able to see each other face-to-face we will get her vital signs, and ask her to sign to have the records from [facility] sent to me.  Patient verbalizes understanding. Continue Adderall XR 20 mg p.o. every morning. Return in 4 to 6 weeks.   Melony Overly, PA-C   This record has been created using [employer].  Chart creation errors have been sought, but may not always have been located and corrected. Such creation errors do not reflect on the standard of medical care.

## 2018-05-30 ENCOUNTER — Other Ambulatory Visit: Payer: Self-pay

## 2018-05-30 ENCOUNTER — Telehealth: Payer: Self-pay | Admitting: Physician Assistant

## 2018-05-30 MED ORDER — AMPHETAMINE-DEXTROAMPHET ER 20 MG PO CP24: 20.0000 mg | ORAL_CAPSULE | Freq: Every day | ORAL | 0 refills | Status: DC

## 2018-05-30 NOTE — Telephone Encounter (Signed)
Brittany Hart, please call her and set up appt.  Once she sets it up, I'll send this in.

## 2018-05-30 NOTE — Telephone Encounter (Signed)
Patient made an appointment for 5/15 

## 2018-05-30 NOTE — Telephone Encounter (Signed)
Will submit to provider for approval, pt was instructed to follow up 4-6 weeks after 03/30 appt

## 2018-05-30 NOTE — Telephone Encounter (Signed)
Patient called and said that she needs a refill on her adderrall 20 mg xr [facility] [address]. Her last visit was march 30th.

## 2018-06-15 ENCOUNTER — Encounter: Payer: Self-pay | Admitting: Physician Assistant

## 2018-06-15 ENCOUNTER — Other Ambulatory Visit: Payer: Self-pay

## 2018-06-15 ENCOUNTER — Ambulatory Visit (INDEPENDENT_AMBULATORY_CARE_PROVIDER_SITE_OTHER): Payer: 59 | Admitting: Physician Assistant

## 2018-06-15 DIAGNOSIS — F900 Attention-deficit hyperactivity disorder, predominantly inattentive type: Secondary | ICD-10-CM

## 2018-06-15 MED ORDER — AMPHETAMINE-DEXTROAMPHET ER 20 MG PO CP24: 20.0000 mg | ORAL_CAPSULE | Freq: Every day | ORAL | 0 refills | Status: DC

## 2018-06-15 NOTE — Progress Notes (Signed)
[facility] Med Check  Patient ID: Toney RakesBarbara Hart,  MRN: [id number]  PCP: Carilyn GoodpastureWillard, Jennifer, PA-C  Date of Evaluation: 06/15/2018 Time spent:15 minutes  Chief Complaint:  Chief Complaint    ADHD; Follow-up     Virtual Visit via Telephone Note  I connected with patient by a video enabled telemedicine application or telephone, with their informed consent, and verified patient privacy and that I am speaking with the correct person using two identifiers.  I am private, in my home and the patient is home.  I discussed the limitations, risks, security and privacy concerns of performing an evaluation and management service by telephone and the availability of in person appointments. I also discussed with the patient that there may be a patient responsible charge related to this service. The patient expressed understanding and agreed to proceed.   I discussed the assessment and treatment plan with the patient. The patient was provided an opportunity to ask questions and all were answered. The patient agreed with the plan and demonstrated an understanding of the instructions.   The patient was advised to call back or seek an in-person evaluation if the symptoms worsen or if the condition fails to improve as anticipated.  I provided 15 minutes of non-face-to-face time during this encounter.  HISTORY/CURRENT STATUS: HPI For routine med check.  Doing well with her meds.  No problems at all.  She is able to focus and finish tasks.  Not as easily distracted.  Denies any side effects such as palpitations or tachycardia.  She sleeps well most of the time.  Patient denies loss of interest in usual activities and is able to enjoy things.  Denies decreased energy or motivation.  Appetite has not changed.  No extreme sadness, tearfulness, or feelings of hopelessness.  Denies any changes in concentration, making decisions or remembering things.  Denies suicidal or homicidal thoughts.  Denies anxiety.   She used to have anxiety when she was unable to get things done but that is been a lot better since she has been on the Adderall.  Denies dizziness, syncope, seizures, numbness, tingling, tremor, tics, unsteady gait, slurred speech, confusion. Denies muscle or joint pain, stiffness, or dystonia.  Individual Medical History/ Review of Systems: Changes? :No    Past medications for mental health diagnoses include: none  Allergies: Patient has no known allergies.  Current Medications:  Current Outpatient Medications:  .  [START ON 08/26/2018] amphetamine-dextroamphetamine (ADDERALL XR) 20 MG 24 hr capsule, Take 1 capsule (20 mg total) by mouth daily., Disp: 30 capsule, Rfl: 0 .  [START ON 07/29/2018] amphetamine-dextroamphetamine (ADDERALL XR) 20 MG 24 hr capsule, Take 1 capsule (20 mg total) by mouth daily., Disp: 30 capsule, Rfl: 0 .  norethindrone-ethinyl estradiol (JUNEL FE 1/20) 1-20 MG-MCG tablet, Take 1 tablet by mouth daily., Disp: 3 Package, Rfl: 3 .  [START ON 06/28/2018] amphetamine-dextroamphetamine (ADDERALL XR) 20 MG 24 hr capsule, Take 1 capsule (20 mg total) by mouth daily., Disp: 30 capsule, Rfl: 0 .  omeprazole (PRILOSEC) 40 MG capsule, TAKE 1 CAPSULE BY MOUTH 30MINS BEFORE MORNING MEAL EVERY DAY, Disp: , Rfl:  .  SUMAtriptan (IMITREX) 50 MG tablet, TAKE 1 TABLET BY MOUTH ONCE WHEN HEADACHE STARTS, OK TO REPEAT AFTER 2 HOURS, Disp: , Rfl:  Medication Side Effects: none  Family Medical/ Social History: Changes? Yes coronavirus pandemic, hasn't really affected her that much.  Except her son is doing online school.  MENTAL HEALTH EXAM:  There were no vitals taken for this visit.There is  no height or weight on file to calculate BMI.  General Appearance: unable to assess  Eye Contact:  unable to assess  Speech:  Clear and Coherent  Volume:  Normal  Mood:  Euthymic  Affect:  unable to assess  Thought Process:  Goal Directed  Orientation:  Full (Time, Place, and Person)  Thought  Content: Logical   Suicidal Thoughts:  No  Homicidal Thoughts:  No  Memory:  WNL  Judgement:  Good  Insight:  Good  Psychomotor Activity:  unable to assess  Concentration:  Concentration: Good and Attention Span: Good  Recall:  Good  Fund of Knowledge: Good  Language: Good  Assets:  Desire for Improvement  ADL's:  Intact  Cognition: WNL  Prognosis:  Good    DIAGNOSES:    ICD-10-CM   1. Attention deficit hyperactivity disorder (ADHD), predominantly inattentive type F90.0     Receiving Psychotherapy: No    RECOMMENDATIONS:  Continue Adderall XR 20 mg p.o. every morning.  PDMP was reviewed. Return in 6 months.  Melony Overly, PA-C   This record has been created using [employer].  Chart creation errors have been sought, but may not always have been located and corrected. Such creation errors do not reflect on the standard of medical care.

## 2018-09-27 ENCOUNTER — Other Ambulatory Visit: Payer: Self-pay | Admitting: Obstetrics and Gynecology

## 2018-09-27 NOTE — Telephone Encounter (Signed)
Medication refill request: Junel Last AEX:  10/17/2017 JJ Next AEX: 10/19/2018 Last MMG (if hormonal medication request): n/a Refill authorized: Pending authorization. #[id number] with no refills if appropriate. Please advise.

## 2018-10-02 ENCOUNTER — Telehealth: Payer: Self-pay | Admitting: Physician Assistant

## 2018-10-02 ENCOUNTER — Other Ambulatory Visit: Payer: Self-pay

## 2018-10-02 MED ORDER — AMPHETAMINE-DEXTROAMPHET ER 20 MG PO CP24: 20.0000 mg | ORAL_CAPSULE | Freq: Every day | ORAL | 0 refills | Status: DC

## 2018-10-02 NOTE — Telephone Encounter (Signed)
Patient valled and said that she needs a refill on her adderall 20 mg xr [facility] [address] She was seen in may and is to return in 6 months

## 2018-10-02 NOTE — Telephone Encounter (Signed)
Pended for approval, last refill 08/29/2018

## 2018-10-19 ENCOUNTER — Ambulatory Visit: Payer: Managed Care, Other (non HMO) | Admitting: Obstetrics and Gynecology

## 2018-12-17 ENCOUNTER — Other Ambulatory Visit: Payer: Self-pay | Admitting: Obstetrics and Gynecology

## 2018-12-18 NOTE — Telephone Encounter (Signed)
Patient scheduled her aex 01/11/19 at 4:00pm with Dr.Jertson.

## 2018-12-18 NOTE — Telephone Encounter (Signed)
Message left to return call to [facility] at [phone].   Patient needs to schedule aex for refills.

## 2018-12-18 NOTE — Telephone Encounter (Signed)
Medication refill request: Junel  Last AEX:  10-17-17 JJ  Next AEX: 01-11-2019  Last MMG (if hormonal medication request): n/a Refill authorized: Today, please advise.  Medication pended for #28, 0RF. Please refill if appropriate.   

## 2019-01-02 ENCOUNTER — Telehealth: Payer: Self-pay | Admitting: Physician Assistant

## 2019-01-02 NOTE — Telephone Encounter (Signed)
Jesiah called to request refill of her adderall.  Appt 02/07/19.  Send to [facility].  However, she feels there medicine is not working as well as it used.  It seems to wear off.  Wondering if she should increase the dose.

## 2019-01-03 ENCOUNTER — Other Ambulatory Visit: Payer: Self-pay | Admitting: Physician Assistant

## 2019-01-03 MED ORDER — AMPHETAMINE-DEXTROAMPHET ER 25 MG PO CP24: 25.0000 mg | ORAL_CAPSULE | ORAL | 0 refills | Status: DC

## 2019-01-03 NOTE — Telephone Encounter (Signed)
I increased her dose and sent in only 1 Rx.

## 2019-01-03 NOTE — Telephone Encounter (Signed)
Prior authorization submitted and approved for amphetamine-dextroamphetamine XR 25 mg PA Case: [id number], Status: Approved, Coverage Starts on: 01/03/2019 12:00:00 AM, Coverage Ends on: 01/03/2020 12:00:00 AM. With [employer]    Left patient voicemail with information and Rx sent to pharmacy

## 2019-01-04 ENCOUNTER — Telehealth: Payer: Self-pay

## 2019-01-04 NOTE — Telephone Encounter (Signed)
error 

## 2019-01-08 ENCOUNTER — Other Ambulatory Visit: Payer: Self-pay | Admitting: Obstetrics and Gynecology

## 2019-01-08 NOTE — Telephone Encounter (Signed)
Medication refill request: Junel  Last AEX:  10-17-17 JJ  Next AEX: 01-11-2019  Last MMG (if hormonal medication request): n/a Refill authorized: Today, please advise.  Medication pended for #28, 0RF. Please refill if appropriate.

## 2019-01-09 NOTE — Progress Notes (Deleted)
38 y.o. G85P2002 Married White or Caucasian Not Hispanic or Latino female here for annual exam.      No LMP recorded.          Sexually active: {yes no:314532}  The current method of family planning is {contraception:315051}.    Exercising: {yes no:314532}  {types:19826} Smoker:  {YES NO:22349}  Health Maintenance: Pap:  10/13/2016 WNL NEG HR HPV, 12-11-12 WNL History of abnormal Pap:  Yes in 2000 MMG:  n/a BMD:   n/a Colonoscopy: n/a TDaP:  Unsure, she will check with her primary Gardasil: no    reports that she has never smoked. She has never used smokeless tobacco. She reports current alcohol use. She reports that she does not use drugs.  Past Medical History:  Diagnosis Date  . ADD (attention deficit disorder)   . Anxiety   . Kidney stone   . Renal calculus or stone    CURRENT    Past Surgical History:  Procedure Laterality Date  . ESOPHAGEAL DILATION    . URETEROSCOPY  06/04/2011   Procedure: URETEROSCOPY;  Surgeon: Claybon Jabs, MD;  Location: [facility];  Service: Urology;  Laterality: Left;  cystoscopy, Left ureteroscopy with lithotripsy holmium laser    Current Outpatient Medications  Medication Sig Dispense Refill  . amphetamine-dextroamphetamine (ADDERALL XR) 25 MG 24 hr capsule Take 1 capsule by mouth every morning. 30 capsule 0  . JUNEL FE 1/20 1-20 MG-MCG tablet TAKE 1 TABLET BY MOUTH EVERY DAY 28 tablet 0  . omeprazole (PRILOSEC) 40 MG capsule TAKE 1 CAPSULE BY MOUTH 30MINS BEFORE MORNING MEAL EVERY DAY    . SUMAtriptan (IMITREX) 50 MG tablet TAKE 1 TABLET BY MOUTH ONCE WHEN HEADACHE STARTS, OK TO REPEAT AFTER 2 HOURS     No current facility-administered medications for this visit.     Family History  Problem Relation Age of Onset  . Spondylitis Mother   . Bronchiolitis Mother   . Depression Mother   . Anxiety disorder Father   . Depression Father   . GER disease Father   . Seizures Father   . Breast cancer Maternal Grandmother   .  Hypertension Maternal Grandmother   . Depression Maternal Grandmother   . Anxiety disorder Maternal Grandmother   . Dementia Maternal Grandmother   . Breast cancer Paternal Grandmother   . Kidney cancer Maternal Grandfather   . CVA Paternal Grandfather   . ADD / ADHD Son   . Healthy Son     Review of Systems  Exam:   There were no vitals taken for this visit.  Weight change: @WEIGHTCHANGE @ Height:      Ht Readings from Last 3 Encounters:  10/17/17 5\' 3"  (1.6 m)  10/13/16 5\' 3"  (1.6 m)  03/22/13 5\' 3"  (1.6 m)    General appearance: alert, cooperative and appears stated age Head: Normocephalic, without obvious abnormality, atraumatic Neck: no adenopathy, supple, symmetrical, trachea midline and thyroid {CHL AMB PHY EX THYROID NORM DEFAULT:[phone]::"normal to inspection and palpation"} Lungs: clear to auscultation bilaterally Cardiovascular: regular rate and rhythm Breasts: {Exam; breast:13139::"normal appearance, no masses or tenderness"} Abdomen: soft, non-tender; non distended,  no masses,  no organomegaly Extremities: extremities normal, atraumatic, no cyanosis or edema Skin: Skin color, texture, turgor normal. No rashes or lesions Lymph nodes: Cervical, supraclavicular, and axillary nodes normal. No abnormal inguinal nodes palpated Neurologic: Grossly normal   Pelvic: External genitalia:  no lesions              Urethra:  normal appearing urethra with no masses, tenderness or lesions              Bartholins and Skenes: normal                 Vagina: normal appearing vagina with normal color and discharge, no lesions              Cervix: {CHL AMB PHY EX CERVIX NORM DEFAULT:[phone]::"no lesions"}               Bimanual Exam:  Uterus:  {CHL AMB PHY EX UTERUS NORM DEFAULT:[phone]::"normal size, contour, position, consistency, mobility, non-tender"}              Adnexa: {CHL AMB PHY EX ADNEXA NO MASS DEFAULT:[phone]::"no mass, fullness, tenderness"}                Rectovaginal: Confirms               Anus:  normal sphincter tone, no lesions  Chaperone was present for exam.  A:  Well Woman with normal exam  P:

## 2019-01-11 ENCOUNTER — Ambulatory Visit: Payer: Self-pay | Admitting: Obstetrics and Gynecology

## 2019-02-03 ENCOUNTER — Other Ambulatory Visit: Payer: Self-pay | Admitting: Obstetrics and Gynecology

## 2019-02-05 NOTE — Telephone Encounter (Signed)
Medication refill request: junel fe 1/20 Last AEX:  10-17-17 Next AEX: 02-28-2019 Last MMG (if hormonal medication request): none Refill authorized: please approve until aex if appropriate

## 2019-02-07 ENCOUNTER — Encounter: Payer: Self-pay | Admitting: Physician Assistant

## 2019-02-07 ENCOUNTER — Ambulatory Visit (INDEPENDENT_AMBULATORY_CARE_PROVIDER_SITE_OTHER): Payer: 59 | Admitting: Physician Assistant

## 2019-02-07 ENCOUNTER — Other Ambulatory Visit: Payer: Self-pay

## 2019-02-07 VITALS — BP 129/84 | HR 83

## 2019-02-07 DIAGNOSIS — F418 Other specified anxiety disorders: Secondary | ICD-10-CM | POA: Diagnosis not present

## 2019-02-07 DIAGNOSIS — F900 Attention-deficit hyperactivity disorder, predominantly inattentive type: Secondary | ICD-10-CM

## 2019-02-07 MED ORDER — AMPHETAMINE-DEXTROAMPHET ER 30 MG PO CP24: 30.0000 mg | ORAL_CAPSULE | Freq: Every day | ORAL | 0 refills | Status: DC

## 2019-02-07 NOTE — Progress Notes (Signed)
[facility] Med Check  Patient ID: Brittany Hart,  MRN: [id number]  PCP: Carilyn Goodpasture, PA-C  Date of Evaluation: 02/07/2019 Time spent:15 minutes  Chief Complaint:  Chief Complaint    ADD; Follow-up      HISTORY/CURRENT STATUS: HPI For routine med check.  We increased the Adderall last month. It has helped a little.  Over the past few months, doesn't feel like previous dose was lasting long enough. When she gets home from work, she just wants to sit down and do nothing, even with the new dose that was started last month.  Patient denies loss of interest in usual activities and is able to enjoy things.  Denies decreased energy or motivation.  Appetite has not changed.  No extreme sadness, tearfulness, or feelings of hopelessness.  Denies any changes in concentration, making decisions or remembering things.  Denies suicidal or homicidal thoughts.  She has started having a little more anxiety lately.  Now that the Adderall is not working as well as it used to, she gets anxious and uptight because she is not able to get things done.  She has taken on more responsibility at work in the past 6 or 8 months to however and that may be playing a part.  Denies dizziness, syncope, seizures, numbness, tingling, tremor, tics, unsteady gait, slurred speech, confusion. Denies muscle or joint pain, stiffness, or dystonia.  Individual Medical History/ Review of Systems: Changes? :No    Past medications for mental health diagnoses include: none  Allergies: Patient has no known allergies.  Current Medications:  Current Outpatient Medications:  .  JUNEL FE 1/20 1-20 MG-MCG tablet, TAKE 1 TABLET BY MOUTH EVERY DAY, Disp: 28 tablet, Rfl: 0 .  amphetamine-dextroamphetamine (ADDERALL XR) 30 MG 24 hr capsule, Take 1 capsule (30 mg total) by mouth daily., Disp: 30 capsule, Rfl: 0 .  omeprazole (PRILOSEC) 40 MG capsule, TAKE 1 CAPSULE BY MOUTH BEFORE MORNING MEAL EVERY DAY, Disp: , Rfl:  .   SUMAtriptan (IMITREX) 50 MG tablet, TAKE 1 TABLET BY MOUTH ONCE WHEN HEADACHE STARTS, OK TO REPEAT AFTER 2 HOURS, Disp: , Rfl:  Medication Side Effects: none  Family Medical/ Social History: Changes? Yes coronavirus pandemic, hasn't really affected her that much.  Except her son is doing online school.  MENTAL HEALTH EXAM:  Blood pressure 129/84, pulse 83.There is no height or weight on file to calculate BMI.  General Appearance: Casual, Neat and Well Groomed  Eye Contact:  Good  Speech:  Clear and Coherent  Volume:  Normal  Mood:  Euthymic  Affect:  Appropriate  Thought Process:  Goal Directed  Orientation:  Full (Time, Place, and Person)  Thought Content: Logical   Suicidal Thoughts:  No  Homicidal Thoughts:  No  Memory:  WNL  Judgement:  Good  Insight:  Good  Psychomotor Activity:  Normal  Concentration:  Concentration: Good and Attention Span: Fair  Recall:  Good  Fund of Knowledge: Good  Language: Good  Assets:  Desire for Improvement  ADL's:  Intact  Cognition: WNL  Prognosis:  Good    DIAGNOSES:    ICD-10-CM   1. Attention deficit hyperactivity disorder (ADHD), predominantly inattentive type  F90.0   2. Situational anxiety  F41.8     Receiving Psychotherapy: No    RECOMMENDATIONS:  PDMP was reviewed.  We discussed different options, including increasing the Adderall XR, or decreasing the dose but doing the XR twice daily, or adding an IR dose in the mid to late afternoon.  At this point, we both prefer to do just the 1 pill in the morning so we will increase the Adderall.  At the next visit though if she still is having trouble in the late afternoon or early evening, our plan will be to add on a low dose of instant release Adderall. Increase Adderall XR 30 mg p.o. every morning.   Hopefully by increasing the Adderall, the anxiety will improve as it has in the past. Return in 4 weeks.  Donnal Moat, PA-C

## 2019-02-15 ENCOUNTER — Other Ambulatory Visit: Payer: Self-pay

## 2019-02-15 ENCOUNTER — Ambulatory Visit (INDEPENDENT_AMBULATORY_CARE_PROVIDER_SITE_OTHER): Payer: 59 | Admitting: Addiction (Substance Use Disorder)

## 2019-02-15 DIAGNOSIS — F418 Other specified anxiety disorders: Secondary | ICD-10-CM | POA: Diagnosis not present

## 2019-02-15 DIAGNOSIS — F900 Attention-deficit hyperactivity disorder, predominantly inattentive type: Secondary | ICD-10-CM

## 2019-02-15 NOTE — Progress Notes (Signed)
[facility] Counsel[facility] Initial Adult Exam  Name: Brittany Hart Date: 02/15/2019 MRN: [id number] DOB: [birth year] PCP: Carlos Levering, PA-C  Time spent: 3:06-4:00 56 mins  Reason for Visit /Presenting Problem: Client came in for therapy in addition to her medication management she is receiving here from [place]. Client came in feeling defeated, out of control, unorganized related to kids. Client reporting increased stress related to work and home stress with managing her households. Client also reported coming into therapy today because of feeling sad, scared, upset since her dad is being released from prison. Client unsure of how to start processing the disappointment of what her dad went to prison for: child pornography. Client flooded by past trauma that occurred as a result of what her father did. Client unsure of how to process her new family and life dynamic and fear of him returning bringing back all the pain and anger she has for what he did. Client reported not grieving/ feeling any of this for 11 years to be able to survive the horrified sickening feelings she had&has and now being flooded with all the emotions/sensations of it all. Therapist and client build therapeutic rapport with client and therapist empathized with client's experience. Therapist provided psychoeducation about addiction, grief, shame, and trauma/grief such as what her family experienced. Therapist ashamed of her dad but also has such a heart to love/understand her dad and why he struggled with the addiction he did. Client interested in understanding/using brainspotting and trauma treatment to help alleviate her pain and calm her nerves about how to discuss this with her kids.  Client participated in the treatment planning of their therapy. Client agreed with the plan and understands what to do if there is a crisis: call 9-1-1 and/or crisis line given by therapist.   Mental Status Exam:   Appearance:   Well Groomed      Behavior:  Appropriate  Motor:  Normal  Speech/Language:   Clear and Coherent  Affect:  Appropriate, Full Range and Tearful  Mood:  angry, anxious, labile and sad  Thought process:  flight of ideas  Thought content:    Obsessions and Rumination  Sensory/Perceptual disturbances:    WNL  Orientation:  x4  Attention:  Good  Concentration:  Good  Memory:  WNL  Fund of knowledge:   Good  Insight:    Good  Judgment:   Good  Impulse Control:  Good   Reported Symptoms:  Angry, sad, upset, scared, grieved, disgusted  Risk Assessment: Danger to Self:  No Self-injurious Behavior: No Danger to Others: No Duty to Warn:no Physical Aggression / Violence:No  Access to Firearms a concern: No  Gang Involvement:No  While future psychiatric events cannot be accurately predicted, the patient does not currently require acute inpatient psychiatric care and does not currently meet [facility] involuntary commitment criteria.  Substance Abuse History: Current substance abuse: No     Past Psychiatric History:   Previous psychological history is significant for ADHD Outpatient Providers: Dr Percell Miller at [place]. History of Psych Hospitalization: No  Psychological Testing: n/a   Abuse History: Victim of No., n/a   Report needed: No. Victim of Neglect:No. Perpetrator of n/a  Witness / Exposure to Domestic Violence: No   Protective Services Involvement: No  Witness to [employer] Violence:  No   Family History:  Family History  Problem Relation Age of Onset  . Spondylitis Mother   . Bronchiolitis Mother   . Depression Mother   . Anxiety disorder Father   . Depression  Father   . GER disease Father   . Seizures Father   . Breast cancer Maternal Grandmother   . Hypertension Maternal Grandmother   . Depression Maternal Grandmother   . Anxiety disorder Maternal Grandmother   . Dementia Maternal Grandmother   . Breast cancer Paternal Grandmother   . Kidney cancer Maternal Grandfather   .  CVA Paternal Grandfather   . ADD / ADHD Son   . Healthy Son     Living situation: the patient lives with their family, husband and 2 sons.  Sexual Orientation:  Straight  Relationship Status: married  Name of spouse / other: Casimiro Needle             If a parent, number of children / ages:[place]-20 & Logan 15.  Support Systems; spouse friends  Financial Stress:  No   Income/Employment/Disability: Employment- works at Aud[employer].  Military Service: No   Educational History: Education: some college- associates  Religion/Sprituality/World View:   Protestant  Any cultural differences that may affect / interfere with treatment:  not applicable   Recreation/Hobbies: yardwork & outdoor activites or offroading with her jeep  Stressors:Marital or family conflict Traumatic event  Strengths:  Family, Friends, Spirituality and Able to Chief Executive Officer History: Pending legal issue / charges: The patient has no significant history of legal issues. History of legal issue / charges: n/a  Medical History/Surgical History:reviewed Past Medical History:  Diagnosis Date  . ADD (attention deficit disorder)   . Anxiety   . Kidney stone   . Renal calculus or stone    CURRENT    Past Surgical History:  Procedure Laterality Date  . ESOPHAGEAL DILATION    . URETEROSCOPY  06/04/2011   Procedure: URETEROSCOPY;  Surgeon: Garnett Farm, MD;  Location: [facility];  Service: Urology;  Laterality: Left;  cystoscopy, Left ureteroscopy with lithotripsy holmium laser    Medications: Current Outpatient Medications  Medication Sig Dispense Refill  . amphetamine-dextroamphetamine (ADDERALL XR) 30 MG 24 hr capsule Take 1 capsule (30 mg total) by mouth daily. 30 capsule 0  . JUNEL FE 1/20 1-20 MG-MCG tablet TAKE 1 TABLET BY MOUTH EVERY DAY 28 tablet 0  . omeprazole (PRILOSEC) 40 MG capsule TAKE 1 CAPSULE BY MOUTH BEFORE MORNING MEAL EVERY DAY    . SUMAtriptan  (IMITREX) 50 MG tablet TAKE 1 TABLET BY MOUTH ONCE WHEN HEADACHE STARTS, OK TO REPEAT AFTER 2 HOURS     No current facility-administered medications for this visit.    No Known Allergies  Diagnoses:    ICD-10-CM   1. Attention deficit hyperactivity disorder (ADHD), predominantly inattentive type  F90.0   2. Situational anxiety  F41.8     Plan of Care:  Client to return for weekly therapy with Zoila Shutter, therapist, to review again in 6 months.  Client to begin setting a daily schedule for weekends or weeknights to help client get motivated or do her hobbies AEB setting SMART goals, not planning too much, and staying moving. Client to engage in positive self talk and challenging negative internal ruminations and self talk causing client to be overly anxious and worried using CBT, on daily practice. Client to engage in mindfulness: ie body scans each eveneing to help process and discharge emotional distress & recognize emotions. Client to utilize BSP (brainspotting) with therapist to help client regulate their anxiety in a somatic- felt body sense way: (ie by working to reduce muscle tension, ruminations, increased heart rate, constant worrying and feeling "hyper" )  and trauma of finding out her dad engaged in child porn, by decreasing anxiety/distress by 33% in each session and overall..  Client to process traumatic events in her life that lead to not trusting others she wants to or to help her feel less ashamed of processing her own emotions. Client to prioritize sleep 8+ hours each week night AEB going to bed by 10pm each night.   Pauline Good, LCSW, LCAS, CCTP, CCS-I, BSP

## 2019-02-22 ENCOUNTER — Ambulatory Visit: Payer: 59 | Admitting: Addiction (Substance Use Disorder)

## 2019-02-26 NOTE — Progress Notes (Signed)
39 y.o. G2P2002 Married White or Caucasian Not Hispanic or Latino female here for annual exam.  She is on OCP's for menorrhagia. Menses q month x 4-5 days. Saturates a regular tampon in 6-8 hours, sometimes just needs a panty liner. No cramps. No dyspareunia  She has gained weight with covid.      Patient's last menstrual period was 02/08/2019 (exact date).          Sexually active: Yes.    The current method of family planning is vasectomy and OCP (estrogen/progesterone).    Exercising: Yes.    some walking  Smoker:  no  Health Maintenance: Pap:    10/13/2016 WNL NEG HR HPV 12-11-12 WNL History of abnormal Pap:  Yes in 2000, f/u was normal.  TDaP:  Unsure, check with her primary   Gardasil: no    reports that she has never smoked. She has never used smokeless tobacco. She reports current alcohol use. She reports that she does not use drugs. Extremely rare ETOH. 2 sons, one in [place], one in [place]. She is a Aeronautical engineer.   Past Medical History:  Diagnosis Date  . ADD (attention deficit disorder)   . Anxiety   . Kidney stone   . Renal calculus or stone    CURRENT    Past Surgical History:  Procedure Laterality Date  . ESOPHAGEAL DILATION    . URETEROSCOPY  06/04/2011   Procedure: URETEROSCOPY;  Surgeon: Claybon Jabs, MD;  Location[facility];  Service: Urology;  Laterality: Left;  cystoscopy, Left ureteroscopy with lithotripsy holmium laser    Current Outpatient Medications  Medication Sig Dispense Refill  . amphetamine-dextroamphetamine (ADDERALL XR) 30 MG 24 hr capsule Take 1 capsule (30 mg total) by mouth daily. 30 capsule 0  . JUNEL FE 1/20 1-20 MG-MCG tablet TAKE 1 TABLET BY MOUTH EVERY DAY 28 tablet 0  . omeprazole (PRILOSEC) 40 MG capsule TAKE 1 CAPSULE BY MOUTH 30MINS BEFORE MORNING MEAL EVERY DAY     No current facility-administered medications for this visit.    Family History  Problem Relation Age of Onset  . Spondylitis Mother   .  Bronchiolitis Mother   . Depression Mother   . Anxiety disorder Father   . Depression Father   . GER disease Father   . Seizures Father   . Breast cancer Maternal Grandmother   . Hypertension Maternal Grandmother   . Depression Maternal Grandmother   . Anxiety disorder Maternal Grandmother   . Dementia Maternal Grandmother   . Breast cancer Paternal Grandmother   . Kidney cancer Maternal Grandfather   . CVA Paternal Grandfather   . ADD / ADHD Son   . Healthy Son   Maternal GM with breast cancer in her 62's PGM with breast cancer in her 36's. No daughters, No other known fh of breast cancer.   Review of Systems  All other systems reviewed and are negative.   Exam:   BP 110/80   Pulse 76   Temp 98.2 F (36.8 C)   Ht 5' 3.25" (1.607 m)   Wt 183 lb (83 kg)   LMP 02/08/2019 (Exact Date)   SpO2 99%   BMI 32.16 kg/m   Weight change: @WEIGHTCHANGE @ Height:   Height: 5' 3.25" (160.7 cm)  Ht Readings from Last 3 Encounters:  02/28/19 5' 3.25" (1.607 m)  10/17/17 5\' 3"  (1.6 m)  10/13/16 5\' 3"  (1.6 m)    General appearance: alert, cooperative and appears stated age Head:  Normocephalic, without obvious abnormality, atraumatic Neck: no adenopathy, supple, symmetrical, trachea midline and thyroid normal to inspection and palpation Lungs: clear to auscultation bilaterally Cardiovascular: regular rate and rhythm Breasts: normal appearance, no masses or tenderness Abdomen: soft, non-tender; non distended,  no masses,  no organomegaly Extremities: extremities normal, atraumatic, no cyanosis or edema Skin: Skin color, texture, turgor normal. No rashes or lesions Lymph nodes: Cervical, supraclavicular, and axillary nodes normal. No abnormal inguinal nodes palpated Neurologic: Grossly normal   Pelvic: External genitalia:  no lesions              Urethra:  normal appearing urethra with no masses, tenderness or lesions              Bartholins and Skenes: normal                  Vagina: normal appearing vagina with normal color and discharge, no lesions              Cervix: no lesions               Bimanual Exam:  Uterus:  normal size, contour, position, consistency, mobility, non-tender and anteverted              Adnexa: no mass, fullness, tenderness               Rectovaginal: Confirms               Anus:  normal sphincter tone, no lesions  Carolynn Serve chaperoned for the exam.  A:  Well Woman with normal exam  PGM had breast cancer in her late 76's or early 44's  On OCP's for cycle control  P:   No pap this year  She doesn't have contact with her Dad, will send for genetic counseling.   Discussed breast self exam  Discussed calcium and vit D intake  Continue OCP's

## 2019-02-28 ENCOUNTER — Encounter: Payer: Self-pay | Admitting: Obstetrics and Gynecology

## 2019-02-28 ENCOUNTER — Other Ambulatory Visit: Payer: Self-pay

## 2019-02-28 ENCOUNTER — Ambulatory Visit: Payer: Managed Care, Other (non HMO) | Admitting: Obstetrics and Gynecology

## 2019-02-28 VITALS — BP 110/80 | HR 76 | Temp 98.2°F | Ht 63.25 in | Wt 183.0 lb

## 2019-02-28 DIAGNOSIS — Z803 Family history of malignant neoplasm of breast: Secondary | ICD-10-CM

## 2019-02-28 DIAGNOSIS — Z01419 Encounter for gynecological examination (general) (routine) without abnormal findings: Secondary | ICD-10-CM

## 2019-02-28 DIAGNOSIS — Z6832 Body mass index (BMI) 32.0-32.9, adult: Secondary | ICD-10-CM

## 2019-02-28 DIAGNOSIS — Z0000 Encounter for general adult medical examination without abnormal findings: Secondary | ICD-10-CM

## 2019-02-28 DIAGNOSIS — Z3041 Encounter for surveillance of contraceptive pills: Secondary | ICD-10-CM

## 2019-02-28 MED ORDER — NORETHIN ACE-ETH ESTRAD-FE 1-20 MG-MCG PO TABS: 1.0000 | ORAL_TABLET | Freq: Every day | ORAL | 3 refills | Status: DC

## 2019-02-28 NOTE — Patient Instructions (Signed)
Roosevelt Locks (counselor) [phone], [phone]  EXERCISE AND DIET:  We recommended that you start or continue a regular exercise program for good health. Regular exercise means any activity that makes your heart beat faster and makes you sweat.  We recommend exercising at least 30 minutes per day at least 3 days a week, preferably 4 or 5.  We also recommend a diet low in fat and sugar.  Inactivity, poor dietary choices and obesity can cause diabetes, heart attack, stroke, and kidney damage, among others.    ALCOHOL AND SMOKING:  Women should limit their alcohol intake to no more than 7 drinks/beers/glasses of wine (combined, not each!) per week. Moderation of alcohol intake to this level decreases your risk of breast cancer and liver damage. And of course, no recreational drugs are part of a healthy lifestyle.  And absolutely no smoking or even second hand smoke. Most people know smoking can cause heart and lung diseases, but did you know it also contributes to weakening of your bones? Aging of your skin?  Yellowing of your teeth and nails?  CALCIUM AND VITAMIN D:  Adequate intake of calcium and Vitamin D are recommended.  The recommendations for exact amounts of these supplements seem to change often, but generally speaking 1,000 mg of calcium (between diet and supplement) and 800 units of Vitamin D per day seems prudent. Certain women may benefit from higher intake of Vitamin D.  If you are among these women, your doctor will have told you during your visit.    PAP SMEARS:  Pap smears, to check for cervical cancer or precancers,  have traditionally been done yearly, although recent scientific advances have shown that most women can have pap smears less often.  However, every woman still should have a physical exam from her gynecologist every year. It will include a breast check, inspection of the vulva and vagina to check for abnormal growths or skin changes, a visual exam of the cervix, and then an  exam to evaluate the size and shape of the uterus and ovaries.  And after 39 years of age, a rectal exam is indicated to check for rectal cancers. We will also provide age appropriate advice regarding health maintenance, like when you should have certain vaccines, screening for sexually transmitted diseases, bone density testing, colonoscopy, mammograms, etc.   MAMMOGRAMS:  All women over 13 years old should have a yearly mammogram. Many facilities now offer a "3D" mammogram, which may cost around $50 extra out of pocket. If possible,  we recommend you accept the option to have the 3D mammogram performed.  It both reduces the number of women who will be called back for extra views which then turn out to be normal, and it is better than the routine mammogram at detecting truly abnormal areas.    COLON CANCER SCREENING: Now recommend starting at age 40. At this time colonoscopy is not covered for routine screening until 50. There are take home tests that can be done between 45-49.   COLONOSCOPY:  Colonoscopy to screen for colon cancer is recommended for all women at age 17.  We know, you hate the idea of the prep.  We agree, BUT, having colon cancer and not knowing it is worse!!  Colon cancer so often starts as a polyp that can be seen and removed at colonscopy, which can quite literally save your life!  And if your first colonoscopy is normal and you have no family history of colon cancer, most women don't  have to have it again for 10 years.  Once every ten years, you can do something that may end up saving your life, right?  We will be happy to help you get it scheduled when you are ready.  Be sure to check your insurance coverage so you understand how much it will cost.  It may be covered as a preventative service at no cost, but you should check your particular policy.      Breast Self-Awareness Breast self-awareness means being familiar with how your breasts look and feel. It involves checking your  breasts regularly and reporting any changes to your health care provider. Practicing breast self-awareness is important. A change in your breasts can be a sign of a serious medical problem. Being familiar with how your breasts look and feel allows you to find any problems early, when treatment is more likely to be successful. All women should practice breast self-awareness, including women who have had breast implants. How to do a breast self-exam One way to learn what is normal for your breasts and whether your breasts are changing is to do a breast self-exam. To do a breast self-exam: Look for Changes  1. Remove all the clothing above your waist. 2. Stand in front of a mirror in a room with good lighting. 3. Put your hands on your hips. 4. Push your hands firmly downward. 5. Compare your breasts in the mirror. Look for differences between them (asymmetry), such as: ? Differences in shape. ? Differences in size. ? Puckers, dips, and bumps in one breast and not the other. 6. Look at each breast for changes in your skin, such as: ? Redness. ? Scaly areas. 7. Look for changes in your nipples, such as: ? Discharge. ? Bleeding. ? Dimpling. ? Redness. ? A change in position. Feel for Changes Carefully feel your breasts for lumps and changes. It is best to do this while lying on your back on the floor and again while sitting or standing in the shower or tub with soapy water on your skin. Feel each breast in the following way:  Place the arm on the side of the breast you are examining above your head.  Feel your breast with the other hand.  Start in the nipple area and make  inch (2 cm) overlapping circles to feel your breast. Use the pads of your three middle fingers to do this. Apply light pressure, then medium pressure, then firm pressure. The light pressure will allow you to feel the tissue closest to the skin. The medium pressure will allow you to feel the tissue that is a little deeper.  The firm pressure will allow you to feel the tissue close to the ribs.  Continue the overlapping circles, moving downward over the breast until you feel your ribs below your breast.  Move one finger-width toward the center of the body. Continue to use the  inch (2 cm) overlapping circles to feel your breast as you move slowly up toward your collarbone.  Continue the up and down exam using all three pressures until you reach your armpit.  Write Down What You Find  Write down what is normal for each breast and any changes that you find. Keep a written record with breast changes or normal findings for each breast. By writing this information down, you do not need to depend only on memory for size, tenderness, or location. Write down where you are in your menstrual cycle, if you are still menstruating. If  you are having trouble noticing differences in your breasts, do not get discouraged. With time you will become more familiar with the variations in your breasts and more comfortable with the exam. How often should I examine my breasts? Examine your breasts every month. If you are breastfeeding, the best time to examine your breasts is after a feeding or after using a breast pump. If you menstruate, the best time to examine your breasts is 5-7 days after your period is over. During your period, your breasts are lumpier, and it may be more difficult to notice changes. When should I see my health care provider? See your health care provider if you notice:  A change in shape or size of your breasts or nipples.  A change in the skin of your breast or nipples, such as a reddened or scaly area.  Unusual discharge from your nipples.  A lump or thick area that was not there before.  Pain in your breasts.  Anything that concerns you.  

## 2019-03-01 LAB — COMPREHENSIVE METABOLIC PANEL
ALT: 9 IU/L (ref 0–32)
AST: 13 IU/L (ref 0–40)
Albumin/Globulin Ratio: 1.7 (ref 1.2–2.2)
Albumin: 4.2 g/dL (ref 3.8–4.8)
Alkaline Phosphatase: 44 IU/L (ref 39–117)
BUN/Creatinine Ratio: 17 (ref 9–23)
BUN: 11 mg/dL (ref 6–20)
Bilirubin Total: 0.4 mg/dL (ref 0.0–1.2)
CO2: 25 mmol/L (ref 20–29)
Calcium: 9.1 mg/dL (ref 8.7–10.2)
Chloride: 102 mmol/L (ref 96–106)
Creatinine, Ser: 0.63 mg/dL (ref 0.57–1.00)
GFR calc Af Amer: 132 mL/min/{1.73_m2} (ref >59)
GFR calc non Af Amer: 114 mL/min/{1.73_m2} (ref >59)
Globulin, Total: 2.5 g/dL (ref 1.5–4.5)
Glucose: 79 mg/dL (ref 65–99)
Potassium: 4.3 mmol/L (ref 3.5–5.2)
Sodium: 138 mmol/L (ref 134–144)
Total Protein: 6.7 g/dL (ref 6.0–8.5)

## 2019-03-01 LAB — LIPID PANEL
Chol/HDL Ratio: 2.5 ratio (ref 0.0–4.4)
Cholesterol, Total: 178 mg/dL (ref 100–199)
HDL: 70 mg/dL (ref >39)
LDL Chol Calc (NIH): 93 mg/dL (ref 0–99)
Triglycerides: 83 mg/dL (ref 0–149)
VLDL Cholesterol Cal: 15 mg/dL (ref 5–40)

## 2019-03-01 LAB — CBC
Hematocrit: 41.6 % (ref 34.0–46.6)
Hemoglobin: 14.4 g/dL (ref 11.1–15.9)
MCH: 31.6 pg (ref 26.6–33.0)
MCHC: 34.6 g/dL (ref 31.5–35.7)
MCV: 91 fL (ref 79–97)
Platelets: 264 10*3/uL (ref 150–450)
RBC: 4.55 x10E6/uL (ref 3.77–5.28)
RDW: 11.5 % — ABNORMAL LOW (ref 11.7–15.4)
WBC: 6.9 10*3/uL (ref 3.4–10.8)

## 2019-03-01 LAB — TSH: TSH: 2.34 u[IU]/mL (ref 0.450–4.500)

## 2019-03-01 LAB — HEMOGLOBIN A1C
Est. average glucose Bld gHb Est-mCnc: 100 mg/dL
Hgb A1c MFr Bld: 5.1 % (ref 4.8–5.6)

## 2019-03-09 ENCOUNTER — Encounter: Payer: Self-pay | Admitting: Physician Assistant

## 2019-03-09 ENCOUNTER — Other Ambulatory Visit: Payer: Self-pay

## 2019-03-09 ENCOUNTER — Ambulatory Visit (INDEPENDENT_AMBULATORY_CARE_PROVIDER_SITE_OTHER): Payer: 59 | Admitting: Physician Assistant

## 2019-03-09 VITALS — BP 129/76 | HR 91

## 2019-03-09 DIAGNOSIS — F418 Other specified anxiety disorders: Secondary | ICD-10-CM

## 2019-03-09 DIAGNOSIS — F900 Attention-deficit hyperactivity disorder, predominantly inattentive type: Secondary | ICD-10-CM | POA: Diagnosis not present

## 2019-03-09 MED ORDER — MULTIVITAMIN ADULT PO TABS: 1.0000 | ORAL_TABLET | Freq: Every day | ORAL | 11 refills | Status: DC

## 2019-03-09 MED ORDER — ZINC 50 MG PO CAPS: 1.0000 | ORAL_CAPSULE | Freq: Every day | ORAL | 11 refills | Status: DC

## 2019-03-09 MED ORDER — AMPHETAMINE-DEXTROAMPHET ER 30 MG PO CP24: 30.0000 mg | ORAL_CAPSULE | Freq: Every day | ORAL | 0 refills | Status: DC

## 2019-03-09 MED ORDER — FISH OIL 600 MG PO CAPS: 1.0000 | ORAL_CAPSULE | Freq: Every day | ORAL | 11 refills | Status: DC

## 2019-03-09 MED ORDER — VITAMIN D 50 MCG (2000 UT) PO CAPS: 2000.0000 [IU] | ORAL_CAPSULE | Freq: Every day | ORAL | 11 refills | Status: DC

## 2019-03-09 MED ORDER — B COMPLEX VITAMINS PO CAPS: 1.0000 | ORAL_CAPSULE | Freq: Every day | ORAL | 11 refills | Status: DC

## 2019-03-09 NOTE — Progress Notes (Signed)
[facility]  Patient ID: Brittany Hart,  MRN: [id number]  PCP: Carlos Levering, PA-C  Date of Evaluation: 03/09/2019 Time spent:20 minutes  Chief Complaint:  Chief Complaint    Follow-up; ADHD      HISTORY/CURRENT STATUS: HPI for routine med check.  At the last visit, we increased the Adderall.  She has responded well and is able to focus and get things done more.  For now, it seems to be lasting long enough so that she can get through the day.  She denies any side effects such as palpitations, insomnia, anxiety.  Patient denies loss of interest in usual activities and is able to enjoy things.  Denies decreased energy or motivation.  Appetite has not changed.  No extreme sadness, tearfulness, or feelings of hopelessness.  Denies any changes in concentration, making decisions or remembering things.  Denies suicidal or homicidal thoughts.  She mentions that she will have random paresthesias in her toes, in her fingers at times.  This is been going on for a couple of years.  It may not happen for months and then will start up again.  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  She recovered from that and what she is experiencing now is a different type of discomfort.  There is no rhyme or reason as to when it happens or what makes it go away.  She just wanted me to be aware of it in case it was related to the Adderall.  She had labs done recently at her GYN.  States "everything was normal.".  Denies dizziness, syncope, seizures, numbness, tremor, tics, unsteady gait, slurred speech, confusion. Denies muscle or joint pain, stiffness, or dystonia.  Individual Medical History/ Review of Systems: Changes? :No    Past medications for mental health diagnoses include: none  Allergies: Patient has no known allergies.  Current Medications:  Current Outpatient Medications:  .  amphetamine-dextroamphetamine (ADDERALL XR) 30 MG 24 hr capsule, Take 1  capsule (30 mg total) by mouth daily., Disp: 30 capsule, Rfl: 0 .  norethindrone-ethinyl estradiol (JUNEL FE 1/20) 1-20 MG-MCG tablet, Take 1 tablet by mouth daily., Disp: 84 tablet, Rfl: 3 .  omeprazole (PRILOSEC) 40 MG capsule, TAKE 1 CAPSULE BY MOUTH 30MINS BEFORE MORNING MEAL EVERY DAY, Disp: , Rfl:  .  [START ON 04/05/2019] amphetamine-dextroamphetamine (ADDERALL XR) 30 MG 24 hr capsule, Take 1 capsule (30 mg total) by mouth daily., Disp: 30 capsule, Rfl: 0 .  [START ON 05/05/2019] amphetamine-dextroamphetamine (ADDERALL XR) 30 MG 24 hr capsule, Take 1 capsule (30 mg total) by mouth daily., Disp: 30 capsule, Rfl: 0 .  b complex vitamins capsule, Take 1 capsule by mouth daily., Disp: 30 capsule, Rfl: 11 .  Cholecalciferol (VITAMIN D) 50 MCG (2000 UT) CAPS, Take 1 capsule (2,000 Units total) by mouth daily., Disp: 30 capsule, Rfl: 11 .  Multiple Vitamin (MULTIVITAMIN ADULT) TABS, Take 1 tablet by mouth daily., Disp: 30 tablet, Rfl: 11 .  Omega-3 Fatty Acids (FISH OIL) 600 MG CAPS, Take 1 capsule by mouth daily., Disp: 30 capsule, Rfl: 11 .  Zinc 50 MG CAPS, Take 1 capsule (50 mg total) by mouth daily., Disp: 30 capsule, Rfl: 11 Medication Side Effects: none  Family Medical/ Social History: Changes? No  MENTAL HEALTH EXAM:  Blood pressure 129/76, pulse 91, last menstrual period 02/08/2019.There is no height or weight on file to calculate BMI.  General Appearance: Casual, Neat and Well Groomed  Eye Contact:  Good  Speech:  Clear and Coherent  Volume:  Normal  Mood:  Euthymic  Affect:  Appropriate  Thought Process:  Goal Directed and Descriptions of Associations: Intact  Orientation:  Full (Time, Place, and Person)  Thought Content: Logical   Suicidal Thoughts:  No  Homicidal Thoughts:  No  Memory:  WNL  Judgement:  Good  Insight:  Good  Psychomotor Activity:  Normal  Concentration:  Concentration: Good and Attention Span: Good  Recall:  Good  Fund of Knowledge: Good  Language: Good   Assets:  Desire for Improvement  ADL's:  Intact  Cognition: WNL  Prognosis:  Good    DIAGNOSES:    ICD-10-CM   1. Attention deficit hyperactivity disorder (ADHD), predominantly inattentive type  F90.0   2. Situational anxiety  F41.8     Receiving Psychotherapy: Yes With Zoila Shutter   RECOMMENDATIONS:  PDMP was reviewed. I spent 20 minutes with her. Concerning the paresthesias that she is feeling, I do not think they are related to the Adderall.  That is not a side effect that is common.  I do recommend that she see her PCP concerning this problem.  I do recommend several supplements that can help with nerve health and those are on the AVS for her.  I discussed it with her as well. Start multivitamin, B complex, vitamin D, fish oil. Continue Adderall XR 30 mg, 1 p.o. every morning. Continue therapy with Zoila Shutter. Return in 3 months.  Melony Overly, PA-C

## 2019-03-14 ENCOUNTER — Telehealth: Payer: Self-pay | Admitting: Physician Assistant

## 2019-03-14 NOTE — Telephone Encounter (Signed)
Quantia called to report that she feels the Adderall is too strong.  She wants to go back to the previous dose.  Next appt 5/6.  Please call to discuss.

## 2019-03-15 ENCOUNTER — Other Ambulatory Visit: Payer: Self-pay | Admitting: Physician Assistant

## 2019-03-15 MED ORDER — AMPHETAMINE-DEXTROAMPHET ER 25 MG PO CP24: 25.0000 mg | ORAL_CAPSULE | ORAL | 0 refills | Status: DC

## 2019-03-15 NOTE — Telephone Encounter (Signed)
Adderall XR 25 mg is appropriate.  I will send that in to [facility].

## 2019-03-15 NOTE — Telephone Encounter (Signed)
At LOV last week, she thought the dose was good.  She'll have to bring her bottle of Adderall here so we can dispose of those pills and then I'll send in the Rx for Adderall XR 20 mg, which is what she used to take.  Also please cancel the prescriptions for Adderall XR 30 mg.  Thanks

## 2019-04-17 ENCOUNTER — Other Ambulatory Visit: Payer: Self-pay

## 2019-04-17 ENCOUNTER — Telehealth: Payer: Self-pay | Admitting: Physician Assistant

## 2019-04-17 MED ORDER — AMPHETAMINE-DEXTROAMPHET ER 25 MG PO CP24: 25.0000 mg | ORAL_CAPSULE | ORAL | 0 refills | Status: DC

## 2019-04-17 NOTE — Telephone Encounter (Signed)
Pt called and wants to switch back to ADDERALL 25 MG. Pt stated pharmacy still has refill for ADDERALL 30 MG but pt no longer wants that dose. [facility] [address]

## 2019-04-17 NOTE — Telephone Encounter (Signed)
Last refill 03/15/2019, pended Adderall 25 mg for Brittany Hart to submit Due back in May 2021

## 2019-05-21 ENCOUNTER — Telehealth: Payer: Self-pay | Admitting: Physician Assistant

## 2019-05-21 ENCOUNTER — Other Ambulatory Visit: Payer: Self-pay

## 2019-05-21 MED ORDER — AMPHETAMINE-DEXTROAMPHET ER 25 MG PO CP24: 25.0000 mg | ORAL_CAPSULE | ORAL | 0 refills | Status: DC

## 2019-05-21 NOTE — Telephone Encounter (Signed)
Patient called and said she needs a refill on her adderall xr 25 mg to [facility] [address]

## 2019-05-21 NOTE — Telephone Encounter (Signed)
Last refill 04/17/2019, pended for Rosey Bath to submit. Has apt 06/07/2019

## 2019-06-06 ENCOUNTER — Telehealth: Payer: Self-pay | Admitting: Physician Assistant

## 2019-06-06 NOTE — Telephone Encounter (Signed)
Last refill 05/21/2019, will pend for Rosey Bath with appropriate fill date

## 2019-06-06 NOTE — Telephone Encounter (Signed)
Brittany Hart called because she had to RS her appt.  Rs to 5/26.  She will need refill of her Adderall.  [facility] to [facility] [address]

## 2019-06-07 ENCOUNTER — Ambulatory Visit: Payer: 59 | Admitting: Physician Assistant

## 2019-06-13 ENCOUNTER — Other Ambulatory Visit: Payer: Self-pay

## 2019-06-13 MED ORDER — AMPHETAMINE-DEXTROAMPHET ER 25 MG PO CP24: 25.0000 mg | ORAL_CAPSULE | ORAL | 0 refills | Status: DC

## 2019-06-27 ENCOUNTER — Telehealth: Payer: Self-pay | Admitting: Physician Assistant

## 2019-06-27 ENCOUNTER — Ambulatory Visit: Payer: 59 | Admitting: Physician Assistant

## 2019-06-27 NOTE — Telephone Encounter (Signed)
Patient had to reschedule appt for 06/27/19 due to provider being out. Pt's work schedule is busy up until July and she is going to need refills on Adderall 25 MG before then. [facility] [address]

## 2019-06-29 NOTE — Telephone Encounter (Signed)
Last refill 06/21/2019, will pend with appropriate refill date

## 2019-07-13 ENCOUNTER — Other Ambulatory Visit: Payer: Self-pay

## 2019-07-13 MED ORDER — AMPHETAMINE-DEXTROAMPHET ER 25 MG PO CP24: 25.0000 mg | ORAL_CAPSULE | ORAL | 0 refills | Status: DC

## 2019-08-10 ENCOUNTER — Telehealth: Payer: Self-pay | Admitting: Physician Assistant

## 2019-08-10 ENCOUNTER — Encounter: Payer: Self-pay | Admitting: Physician Assistant

## 2019-08-10 ENCOUNTER — Telehealth (INDEPENDENT_AMBULATORY_CARE_PROVIDER_SITE_OTHER): Payer: 59 | Admitting: Physician Assistant

## 2019-08-10 DIAGNOSIS — F900 Attention-deficit hyperactivity disorder, predominantly inattentive type: Secondary | ICD-10-CM

## 2019-08-10 MED ORDER — AMPHETAMINE-DEXTROAMPHET ER 25 MG PO CP24: 25.0000 mg | ORAL_CAPSULE | ORAL | 0 refills | Status: DC

## 2019-08-10 NOTE — Progress Notes (Signed)
[facility]  Patient ID: Brittany Hart,  MRN: [id number]  PCP: Carilyn Goodpasture, PA-C  Date of Evaluation: 08/10/2019 Time spent:20 minutes  Chief Complaint:  Chief Complaint    Medication Refill     Virtual Visit via Telephone or Video Note  I connected with patient by a video enabled telemedicine application or telephone, with their informed consent, and verified patient privacy and that I am speaking with the correct person using two identifiers.  I am private, in my office and the patient is home.  I discussed the limitations, risks, security and privacy concerns of performing an evaluation and management service by video and the availability of in person appointments. I also discussed with the patient that there may be a patient responsible charge related to this service. The patient expressed understanding and agreed to proceed.   I discussed the assessment and treatment plan with the patient. The patient was provided an opportunity to ask questions and all were answered. The patient agreed with the plan and demonstrated an understanding of the instructions.   The patient was advised to call back or seek an in-person evaluation if the symptoms worsen or if the condition fails to improve as anticipated.  I provided 20 minutes of non-face-to-face time during this encounter.  HISTORY/CURRENT STATUS: HPI for routine med check.  At the last visit, we increased the Adderall XR.  It caused severe H/A so we decreased back to previous dose.  Is doing okay with focus and attention for the most part.  She does have some lapses in concentration in the afternoons but feels that it is better in the summertime and then in the winter, usually all she wants to do is go home and sit.  She is going to try not to do that when it starts getting dark early again.  Patient denies loss of interest in usual activities and is able to enjoy things.  Denies decreased energy or motivation.   Appetite has not changed.  No extreme sadness, tearfulness, or feelings of hopelessness.  Denies any changes in concentration, making decisions or remembering things.  Denies suicidal or homicidal thoughts.  Denies dizziness, syncope, seizures, numbness, tremor, tics, unsteady gait, slurred speech, confusion. Denies muscle or joint pain, stiffness, or dystonia.  Individual Medical History/ Review of Systems: Changes? :No    Past medications for mental health diagnoses include: none  Allergies: Patient has no known allergies.  Current Medications:  Current Outpatient Medications:  .  [START ON 09/18/2019] amphetamine-dextroamphetamine (ADDERALL XR) 25 MG 24 hr capsule, Take 1 capsule by mouth every morning., Disp: 30 capsule, Rfl: 0 .  Multiple Vitamin (MULTIVITAMIN ADULT) TABS, Take 1 tablet by mouth daily., Disp: 30 tablet, Rfl: 11 .  norethindrone-ethinyl estradiol (JUNEL FE 1/20) 1-20 MG-MCG tablet, Take 1 tablet by mouth daily., Disp: 84 tablet, Rfl: 3 .  [START ON 10/18/2019] amphetamine-dextroamphetamine (ADDERALL XR) 25 MG 24 hr capsule, Take 1 capsule by mouth every morning., Disp: 30 capsule, Rfl: 0 .  [START ON 08/19/2019] amphetamine-dextroamphetamine (ADDERALL XR) 25 MG 24 hr capsule, Take 1 capsule by mouth every morning., Disp: 30 capsule, Rfl: 0 .  b complex vitamins capsule, Take 1 capsule by mouth daily. (Patient not taking: Reported on 08/10/2019), Disp: 30 capsule, Rfl: 11 .  Cholecalciferol (VITAMIN D) 50 MCG (2000 UT) CAPS, Take 1 capsule (2,000 Units total) by mouth daily. (Patient not taking: Reported on 08/10/2019), Disp: 30 capsule, Rfl: 11 .  Omega-3 Fatty Acids (FISH OIL) 600 MG CAPS,  Take 1 capsule by mouth daily. (Patient not taking: Reported on 08/10/2019), Disp: 30 capsule, Rfl: 11 .  omeprazole (PRILOSEC) 40 MG capsule, TAKE 1 CAPSULE BY MOUTH BEFORE MORNING MEAL EVERY DAY (Patient not taking: Reported on 08/10/2019), Disp: , Rfl:  .  Zinc 50 MG CAPS, Take 1 capsule  (50 mg total) by mouth daily. (Patient not taking: Reported on 08/10/2019), Disp: 30 capsule, Rfl: 11 Medication Side Effects: none  Family Medical/ Social History: Changes? No  MENTAL HEALTH EXAM:  There were no vitals taken for this visit.There is no height or weight on file to calculate BMI.  General Appearance: Casual, Neat and Well Groomed  Eye Contact:  Good  Speech:  Clear and Coherent  Volume:  Normal  Mood:  Euthymic  Affect:  Appropriate  Thought Process:  Goal Directed and Descriptions of Associations: Intact  Orientation:  Full (Time, Place, and Person)  Thought Content: Logical   Suicidal Thoughts:  No  Homicidal Thoughts:  No  Memory:  WNL  Judgement:  Good  Insight:  Good  Psychomotor Activity:  Normal  Concentration:  Concentration: Good and Attention Span: Good  Recall:  Good  Fund of Knowledge: Good  Language: Good  Assets:  Desire for Improvement  ADL's:  Intact  Cognition: WNL  Prognosis:  Good    DIAGNOSES:    ICD-10-CM   1. Attention deficit hyperactivity disorder (ADHD), predominantly inattentive type  F90.0     Receiving Psychotherapy: Yes With Zoila Shutter   RECOMMENDATIONS:  PDMP was reviewed. I provided 20 minutes of nonface-to-face time during this encounter. Continue Adderall XR 25 mg, 1 p.o. every morning. We discussed the possible need of adding a low-dose short acting Adderall in the afternoon if needed, after the days start getting shorter.  She will let me know if she feels like she needs it. Continue therapy with Zoila Shutter. Return in 6 months    Melony Overly, [place]

## 2019-08-10 NOTE — Telephone Encounter (Signed)
Ms. jaklyn, alen are scheduled for a virtual visit with your provider today.    Just as we do with appointments in the office, we must obtain your consent to participate.  Your consent will be active for this visit and any virtual visit you may have with one of our providers in the next 365 days.    If you have a MyChart account, I can also send a copy of this consent to you electronically.  All virtual visits are billed to your insurance company just like a traditional visit in the office.  As this is a virtual visit, video technology does not allow for your provider to perform a traditional examination.  This may limit your provider's ability to fully assess your condition.  If your provider identifies any concerns that need to be evaluated in person or the need to arrange testing such as labs, EKG, etc, we will make arrangements to do so.    Although advances in technology are sophisticated, we cannot ensure that it will always work on either your end or our end.  If the connection with a video visit is poor, we may have to switch to a telephone visit.  With either a video or telephone visit, we are not always able to ensure that we have a secure connection.   I need to obtain your verbal consent now.   Are you willing to proceed with your visit today?   Versa Craton has provided verbal consent on 08/10/2019 for a virtual visit (video or telephone).   Melony Overly, PA-C 08/10/2019  5:13 PM

## 2019-11-22 ENCOUNTER — Other Ambulatory Visit: Payer: Self-pay

## 2019-11-22 ENCOUNTER — Telehealth: Payer: Self-pay | Admitting: Physician Assistant

## 2019-11-22 MED ORDER — AMPHETAMINE-DEXTROAMPHET ER 25 MG PO CP24: 25.0000 mg | ORAL_CAPSULE | ORAL | 0 refills | Status: DC

## 2019-11-22 NOTE — Telephone Encounter (Signed)
Pt called and said that she needs a refill on her adderall xr 25 mg [facility] [address]. She is due back in January. She is out of her medication

## 2019-11-22 NOTE — Telephone Encounter (Signed)
Last refill 10/21/2019 3 Rx's pended for Rosey Bath to send

## 2019-12-23 ENCOUNTER — Other Ambulatory Visit: Payer: Self-pay | Admitting: Obstetrics and Gynecology

## 2019-12-23 DIAGNOSIS — Z3041 Encounter for surveillance of contraceptive pills: Secondary | ICD-10-CM

## 2019-12-24 NOTE — Telephone Encounter (Signed)
Medication refill request: Junel FE 1/20 Last AEX:  02/28/19 Next AEX: 03/03/20 Last MMG (if hormonal medication request): NA Refill authorized: 84/0

## 2020-01-03 DIAGNOSIS — M545 Low back pain, unspecified: Secondary | ICD-10-CM | POA: Insufficient documentation

## 2020-02-02 HISTORY — PX: ESOPHAGOGASTRODUODENOSCOPY (EGD) WITH ESOPHAGEAL DILATION: SHX5812

## 2020-02-22 ENCOUNTER — Other Ambulatory Visit: Payer: Self-pay | Admitting: Obstetrics and Gynecology

## 2020-02-22 DIAGNOSIS — Z3041 Encounter for surveillance of contraceptive pills: Secondary | ICD-10-CM

## 2020-02-25 ENCOUNTER — Other Ambulatory Visit: Payer: Self-pay | Admitting: Physician Assistant

## 2020-02-25 ENCOUNTER — Telehealth: Payer: Self-pay | Admitting: Physician Assistant

## 2020-02-25 MED ORDER — AMPHETAMINE-DEXTROAMPHET ER 25 MG PO CP24: 25.0000 mg | ORAL_CAPSULE | ORAL | 0 refills | Status: DC

## 2020-02-25 NOTE — Telephone Encounter (Signed)
Rx resent.

## 2020-02-25 NOTE — Telephone Encounter (Signed)
Rx was sent. Needs OV for more.

## 2020-02-25 NOTE — Telephone Encounter (Signed)
Pt left a message stating that she needs a refill on her adderall xr 25 mg [facility] [address]

## 2020-02-25 NOTE — Telephone Encounter (Signed)
Annual exam scheduled on 03/03/20

## 2020-02-25 NOTE — Telephone Encounter (Signed)
The tramission failed to send the adderall. She has an appt for 04/18/20. So please resend the script

## 2020-03-03 ENCOUNTER — Encounter: Payer: Self-pay | Admitting: Obstetrics and Gynecology

## 2020-03-03 ENCOUNTER — Ambulatory Visit (INDEPENDENT_AMBULATORY_CARE_PROVIDER_SITE_OTHER): Payer: Managed Care, Other (non HMO) | Admitting: Obstetrics and Gynecology

## 2020-03-03 ENCOUNTER — Other Ambulatory Visit: Payer: Self-pay

## 2020-03-03 VITALS — BP 110/70 | HR 80 | Ht 63.25 in | Wt 200.0 lb

## 2020-03-03 DIAGNOSIS — N3946 Mixed incontinence: Secondary | ICD-10-CM | POA: Diagnosis not present

## 2020-03-03 DIAGNOSIS — K219 Gastro-esophageal reflux disease without esophagitis: Secondary | ICD-10-CM | POA: Insufficient documentation

## 2020-03-03 DIAGNOSIS — Z3041 Encounter for surveillance of contraceptive pills: Secondary | ICD-10-CM | POA: Diagnosis not present

## 2020-03-03 DIAGNOSIS — F902 Attention-deficit hyperactivity disorder, combined type: Secondary | ICD-10-CM | POA: Insufficient documentation

## 2020-03-03 DIAGNOSIS — Z6835 Body mass index (BMI) 35.0-35.9, adult: Secondary | ICD-10-CM | POA: Diagnosis not present

## 2020-03-03 DIAGNOSIS — E739 Lactose intolerance, unspecified: Secondary | ICD-10-CM | POA: Insufficient documentation

## 2020-03-03 DIAGNOSIS — M4802 Spinal stenosis, cervical region: Secondary | ICD-10-CM | POA: Insufficient documentation

## 2020-03-03 DIAGNOSIS — Z01419 Encounter for gynecological examination (general) (routine) without abnormal findings: Secondary | ICD-10-CM

## 2020-03-03 DIAGNOSIS — R635 Abnormal weight gain: Secondary | ICD-10-CM

## 2020-03-03 DIAGNOSIS — N946 Dysmenorrhea, unspecified: Secondary | ICD-10-CM | POA: Insufficient documentation

## 2020-03-03 DIAGNOSIS — J309 Allergic rhinitis, unspecified: Secondary | ICD-10-CM | POA: Insufficient documentation

## 2020-03-03 DIAGNOSIS — F411 Generalized anxiety disorder: Secondary | ICD-10-CM | POA: Insufficient documentation

## 2020-03-03 DIAGNOSIS — Z0000 Encounter for general adult medical examination without abnormal findings: Secondary | ICD-10-CM

## 2020-03-03 DIAGNOSIS — M4310 Spondylolisthesis, site unspecified: Secondary | ICD-10-CM | POA: Insufficient documentation

## 2020-03-03 DIAGNOSIS — G43009 Migraine without aura, not intractable, without status migrainosus: Secondary | ICD-10-CM | POA: Insufficient documentation

## 2020-03-03 DIAGNOSIS — K222 Esophageal obstruction: Secondary | ICD-10-CM | POA: Insufficient documentation

## 2020-03-03 DIAGNOSIS — G479 Sleep disorder, unspecified: Secondary | ICD-10-CM | POA: Insufficient documentation

## 2020-03-03 DIAGNOSIS — R1310 Dysphagia, unspecified: Secondary | ICD-10-CM | POA: Insufficient documentation

## 2020-03-03 DIAGNOSIS — E559 Vitamin D deficiency, unspecified: Secondary | ICD-10-CM

## 2020-03-03 LAB — URINALYSIS, COMPLETE W/RFL CULTURE
Bacteria, UA: NONE SEEN /HPF
Bilirubin Urine: NEGATIVE
Glucose, UA: NEGATIVE
Hgb urine dipstick: NEGATIVE
Hyaline Cast: NONE SEEN /LPF
Leukocyte Esterase: NEGATIVE
Nitrites, Initial: NEGATIVE
Protein, ur: NEGATIVE
RBC / HPF: NONE SEEN /HPF (ref 0–2)
Specific Gravity, Urine: 1.02 (ref 1.001–1.03)
pH: 7 (ref 5.0–8.0)

## 2020-03-03 MED ORDER — NORETHIN ACE-ETH ESTRAD-FE 1-20 MG-MCG PO TABS: 1.0000 | ORAL_TABLET | Freq: Every day | ORAL | 3 refills | Status: DC

## 2020-03-03 NOTE — Patient Instructions (Addendum)
EXERCISE   We recommended that you start or continue a regular exercise program for good health. Physical activity is anything that gets your body moving, some is better than none. The CDC recommends 150 minutes per week of Moderate-Intensity Aerobic Activity and 2 or more days of Muscle Strengthening Activity.  Benefits of exercise are limitless: helps weight loss/weight maintenance, improves mood and energy, helps with depression and anxiety, improves sleep, tones and strengthens muscles, improves balance, improves bone density, protects from chronic conditions such as heart disease, high blood pressure and diabetes and so much more. To learn more visit: https://www.cdc.gov/physicalactivity/index.html  DIET: Good nutrition starts with a healthy diet of fruits, vegetables, whole grains, and lean protein sources. Drink plenty of water for hydration. Minimize empty calories, sodium, sweets. For more information about dietary recommendations visit: https://health.gov/our-work/nutrition-physical-activity/dietary-guidelines and https://www.myplate.gov/  ALCOHOL:  Women should limit their alcohol intake to no more than 7 drinks/beers/glasses of wine (combined, not each!) per week. Moderation of alcohol intake to this level decreases your risk of breast cancer and liver damage.  If you are concerned that you may have a problem, or your friends have told you they are concerned about your drinking, there are many resources to help. A well-known program that is free, effective, and available to all people all over the nation is Alcoholics Anonymous.  Check out this site to learn more: https://www.aa.org/   CALCIUM AND VITAMIN D:  Adequate intake of calcium and Vitamin D are recommended for bone health.  You should be getting between 1000-1200 mg of calcium and 800 units of Vitamin D daily between diet and supplements  PAP SMEARS:  Pap smears, to check for cervical cancer or precancers,  have traditionally been  done yearly, scientific advances have shown that most women can have pap smears less often.  However, every woman still should have a physical exam from her gynecologist every year. It will include a breast check, inspection of the vulva and vagina to check for abnormal growths or skin changes, a visual exam of the cervix, and then an exam to evaluate the size and shape of the uterus and ovaries. We will also provide age appropriate advice regarding health maintenance, like when you should have certain vaccines, screening for sexually transmitted diseases, bone density testing, colonoscopy, mammograms, etc.   MAMMOGRAMS:  All women over 40 years old should have a routine mammogram.   COLON CANCER SCREENING: Now recommend starting at age 45. At this time colonoscopy is not covered for routine screening until 50. There are take home tests that can be done between 45-49.   COLONOSCOPY:  Colonoscopy to screen for colon cancer is recommended for all women at age 50.  We know, you hate the idea of the prep.  We agree, BUT, having colon cancer and not knowing it is worse!!  Colon cancer so often starts as a polyp that can be seen and removed at colonscopy, which can quite literally save your life!  And if your first colonoscopy is normal and you have no family history of colon cancer, most women don't have to have it again for 10 years.  Once every ten years, you can do something that may end up saving your life, right?  We will be happy to help you get it scheduled when you are ready.  Be sure to check your insurance coverage so you understand how much it will cost.  It may be covered as a preventative service at no cost, but you should check   your particular policy.      Breast Self-Awareness Breast self-awareness means being familiar with how your breasts look and feel. It involves checking your breasts regularly and reporting any changes to your health care provider. Practicing breast self-awareness is  important. A change in your breasts can be a sign of a serious medical problem. Being familiar with how your breasts look and feel allows you to find any problems early, when treatment is more likely to be successful. All women should practice breast self-awareness, including women who have had breast implants. How to do a breast self-exam One way to learn what is normal for your breasts and whether your breasts are changing is to do a breast self-exam. To do a breast self-exam: Look for Changes  1. Remove all the clothing above your waist. 2. Stand in front of a mirror in a room with good lighting. 3. Put your hands on your hips. 4. Push your hands firmly downward. 5. Compare your breasts in the mirror. Look for differences between them (asymmetry), such as: ? Differences in shape. ? Differences in size. ? Puckers, dips, and bumps in one breast and not the other. 6. Look at each breast for changes in your skin, such as: ? Redness. ? Scaly areas. 7. Look for changes in your nipples, such as: ? Discharge. ? Bleeding. ? Dimpling. ? Redness. ? A change in position. Feel for Changes Carefully feel your breasts for lumps and changes. It is best to do this while lying on your back on the floor and again while sitting or standing in the shower or tub with soapy water on your skin. Feel each breast in the following way:  Place the arm on the side of the breast you are examining above your head.  Feel your breast with the other hand.  Start in the nipple area and make  inch (2 cm) overlapping circles to feel your breast. Use the pads of your three middle fingers to do this. Apply light pressure, then medium pressure, then firm pressure. The light pressure will allow you to feel the tissue closest to the skin. The medium pressure will allow you to feel the tissue that is a little deeper. The firm pressure will allow you to feel the tissue close to the ribs.  Continue the overlapping circles,  moving downward over the breast until you feel your ribs below your breast.  Move one finger-width toward the center of the body. Continue to use the  inch (2 cm) overlapping circles to feel your breast as you move slowly up toward your collarbone.  Continue the up and down exam using all three pressures until you reach your armpit.  Write Down What You Find  Write down what is normal for each breast and any changes that you find. Keep a written record with breast changes or normal findings for each breast. By writing this information down, you do not need to depend only on memory for size, tenderness, or location. Write down where you are in your menstrual cycle, if you are still menstruating. If you are having trouble noticing differences in your breasts, do not get discouraged. With time you will become more familiar with the variations in your breasts and more comfortable with the exam. How often should I examine my breasts? Examine your breasts every month. If you are breastfeeding, the best time to examine your breasts is after a feeding or after using a breast pump. If you menstruate, the best time to   examine your breasts is 5-7 days after your period is over. During your period, your breasts are lumpier, and it may be more difficult to notice changes. When should I see my health care provider? See your health care provider if you notice:  A change in shape or size of your breasts or nipples.  A change in the skin of your breast or nipples, such as a reddened or scaly area.  Unusual discharge from your nipples.  A lump or thick area that was not there before.  Pain in your breasts.  Anything that concerns you.   Kegel Exercises  Kegel exercises can help strengthen your pelvic floor muscles. The pelvic floor is a group of muscles that support your rectum, small intestine, and bladder. In females, pelvic floor muscles also help support the womb (uterus). These muscles help you  control the flow of urine and stool. Kegel exercises are painless and simple, and they do not require any equipment. Your provider may suggest Kegel exercises to:  Improve bladder and bowel control.  Improve sexual response.  Improve weak pelvic floor muscles after surgery to remove the uterus (hysterectomy) or pregnancy (females).  Improve weak pelvic floor muscles after prostate gland removal or surgery (males). Kegel exercises involve squeezing your pelvic floor muscles, which are the same muscles you squeeze when you try to stop the flow of urine or keep from passing gas. The exercises can be done while sitting, standing, or lying down, but it is best to vary your position. Exercises How to do Kegel exercises: 1. Squeeze your pelvic floor muscles tight. You should feel a tight lift in your rectal area. If you are a female, you should also feel a tightness in your vaginal area. Keep your stomach, buttocks, and legs relaxed. 2. Hold the muscles tight for up to 10 seconds. 3. Breathe normally. 4. Relax your muscles. 5. Repeat as told by your health care provider. Repeat this exercise daily as told by your health care provider. Continue to do this exercise for at least 4-6 weeks, or for as long as told by your health care provider. You may be referred to a physical therapist who can help you learn more about how to do Kegel exercises. Depending on your condition, your health care provider may recommend:  Varying how long you squeeze your muscles.  Doing several sets of exercises every day.  Doing exercises for several weeks.  Making Kegel exercises a part of your regular exercise routine. This information is not intended to replace advice given to you by your health care provider. Make sure you discuss any questions you have with your health care provider. Document Revised: 05/25/2019 Document Reviewed: 09/07/2017 Elsevier Patient Education  2021 Elsevier Inc.  

## 2020-03-03 NOTE — Progress Notes (Signed)
40 y.o. G20P2002 Married White or Caucasian Not Hispanic or Latino female here for annual exam.  She is on OCP's for menorrhagia.  Period Cycle (Days): 28 Period Duration (Days): 4-5 Period Pattern: Regular Menstrual Flow: Light Menstrual Control: Thin pad Menstrual Control Change Freq (Hours): 8 Dysmenorrhea: None  Sexually active, no dyspareunia.   She has some mixed urinary incontinence for the last few months. She has been drinking a lot more water. Holds her bladder too long at work. Leaks small amounts. Occurring at least every week. Voids normal amounts when she goes.   She is struggling with her weight. In the past she lost 100 lbs on her own. She was down to a low of 140, now up to about 200 lbs.    Patient's last menstrual period was 02/11/2020.          Sexually active: Yes.    The current method of family planning is vasectomy.    Exercising: Yes.    walking Smoker:  no  Health Maintenance: Pap:   10/13/16 Neg:Neg HR HPV  12/11/12 Neg History of abnormal Pap:  Yes, in 2000, f/u normal TDaP:  Unsure  Gardasil: never   reports that she has never smoked. She has never used smokeless tobacco. She reports previous alcohol use. She reports that she does not use drugs. She is a Administrator. Kids are 16 and 21. Older son has been at [facility] has worked and gone to school, going to university in fall. Younger son is a Holiday representative in [employer], has ADD.  Past Medical History:  Diagnosis Date  . ADD (attention deficit disorder)   . Anxiety   . Kidney stone   . Renal calculus or stone    CURRENT    Past Surgical History:  Procedure Laterality Date  . ESOPHAGEAL DILATION    . URETEROSCOPY  06/04/2011   Procedure: URETEROSCOPY;  Surgeon: Garnett Farm, MD;  Location: [facility];  Service: Urology;  Laterality: Left;  cystoscopy, Left ureteroscopy with lithotripsy holmium laser    Current Outpatient Medications  Medication Sig Dispense Refill  .  amphetamine-dextroamphetamine (ADDERALL XR) 25 MG 24 hr capsule Take 1 capsule by mouth every morning. 30 capsule 0  . JUNEL FE 1/20 1-20 MG-MCG tablet TAKE 1 TABLET BY MOUTH EVERY DAY 28 tablet 0   No current facility-administered medications for this visit.    Family History  Problem Relation Age of Onset  . Spondylitis Mother   . Bronchiolitis Mother   . Depression Mother   . Anxiety disorder Father   . Depression Father   . GER disease Father   . Seizures Father   . Breast cancer Maternal Grandmother   . Hypertension Maternal Grandmother   . Depression Maternal Grandmother   . Anxiety disorder Maternal Grandmother   . Dementia Maternal Grandmother   . Breast cancer Paternal Grandmother   . Kidney cancer Maternal Grandfather   . CVA Paternal Grandfather   . ADD / ADHD Son   . Healthy Son     Review of Systems  Constitutional: Negative.   HENT: Negative.   Eyes: Negative.   Respiratory: Negative.   Cardiovascular: Negative.   Gastrointestinal: Negative.   Endocrine: Negative.   Genitourinary: Negative.   Musculoskeletal: Negative.   Skin: Negative.   Allergic/Immunologic: Negative.   Neurological: Negative.   Hematological: Negative.   Psychiatric/Behavioral: Negative.     Exam:   BP 110/70 (BP Location: Left Arm, Patient Position: Sitting, Cuff Size: Normal)  Pulse 80   Ht 5' 3.25" (1.607 m)   Wt 200 lb (90.7 kg)   LMP 02/11/2020   BMI 35.15 kg/m   Weight change: @WEIGHTCHANGE @ Height:   Height: 5' 3.25" (160.7 cm)  Ht Readings from Last 3 Encounters:  03/03/20 5' 3.25" (1.607 m)  02/28/19 5' 3.25" (1.607 m)  10/17/17 5\' 3"  (1.6 m)    General appearance: alert, cooperative and appears stated age Head: Normocephalic, without obvious abnormality, atraumatic Neck: no adenopathy, supple, symmetrical, trachea midline and thyroid normal to inspection and palpation Lungs: clear to auscultation bilaterally Cardiovascular: regular rate and rhythm Breasts:  normal appearance, no masses or tenderness Abdomen: soft, non-tender; non distended,  no masses,  no organomegaly Extremities: extremities normal, atraumatic, no cyanosis or edema Skin: Skin color, texture, turgor normal. No rashes or lesions Lymph nodes: Cervical, supraclavicular, and axillary nodes normal. No abnormal inguinal nodes palpated Neurologic: Grossly normal   Pelvic: External genitalia:  no lesions              Urethra:  normal appearing urethra with no masses, tenderness or lesions              Bartholins and Skenes: normal                 Vagina: normal appearing vagina with normal color and discharge, no lesions              Cervix: no lesions               Bimanual Exam:  Uterus:  normal size, contour, position, consistency, mobility, non-tender              Adnexa: no mass, fullness, tenderness               Rectovaginal: Confirms               Anus:  normal sphincter tone, no lesions  10/19/17 chaperoned for the exam.  1. Well woman exam Discussed breast self exam Discussed calcium and vit D intake She will set up her mammogram after she turns 40   2. Encounter for surveillance of contraceptive pills  - norethindrone-ethinyl estradiol (JUNEL FE 1/20) 1-20 MG-MCG tablet; Take 1 tablet by mouth daily.  Dispense: 84 tablet; Refill: 3  3. BMI 35.0-35.9,adult -# for weight loss clinic given - Hemoglobin A1c - Lipid panel - TSH  4. Mixed incontinence urge and stress  - Urinalysis,Complete w/RFL Culture  5. Weight gain # for weight loss clinic given  6. Vitamin D deficiency in her diet  - VITAMIN D 25 Hydroxy (Vit-D Deficiency, Fractures)  7. Laboratory exam ordered as part of routine general medical examination  - CBC - Comprehensive metabolic panel - Lipid panel

## 2020-03-04 LAB — COMPREHENSIVE METABOLIC PANEL
AG Ratio: 1.5 (calc) (ref 1.0–2.5)
ALT: 11 U/L (ref 6–29)
AST: 14 U/L (ref 10–30)
Albumin: 4.3 g/dL (ref 3.6–5.1)
Alkaline phosphatase (APISO): 42 U/L (ref 31–125)
BUN: 15 mg/dL (ref 7–25)
CO2: 27 mmol/L (ref 20–32)
Calcium: 9.5 mg/dL (ref 8.6–10.2)
Chloride: 103 mmol/L (ref 98–110)
Creat: 0.75 mg/dL (ref 0.50–1.10)
Globulin: 2.8 g/dL (calc) (ref 1.9–3.7)
Glucose, Bld: 79 mg/dL (ref 65–99)
Potassium: 4 mmol/L (ref 3.5–5.3)
Sodium: 140 mmol/L (ref 135–146)
Total Bilirubin: 0.5 mg/dL (ref 0.2–1.2)
Total Protein: 7.1 g/dL (ref 6.1–8.1)

## 2020-03-04 LAB — CBC
HCT: 40.4 % (ref 35.0–45.0)
Hemoglobin: 13.9 g/dL (ref 11.7–15.5)
MCH: 31.3 pg (ref 27.0–33.0)
MCHC: 34.4 g/dL (ref 32.0–36.0)
MCV: 91 fL (ref 80.0–100.0)
MPV: 10.3 fL (ref 7.5–12.5)
Platelets: 324 10*3/uL (ref 140–400)
RBC: 4.44 10*6/uL (ref 3.80–5.10)
RDW: 11.5 % (ref 11.0–15.0)
WBC: 7.5 10*3/uL (ref 3.8–10.8)

## 2020-03-04 LAB — HEMOGLOBIN A1C
Hgb A1c MFr Bld: 5.1 % of total Hgb (ref <5.7)
Mean Plasma Glucose: 100 mg/dL
eAG (mmol/L): 5.5 mmol/L

## 2020-03-04 LAB — LIPID PANEL
Cholesterol: 199 mg/dL (ref <200)
HDL: 75 mg/dL (ref >=50)
LDL Cholesterol (Calc): 104 mg/dL (calc) — ABNORMAL HIGH
Non-HDL Cholesterol (Calc): 124 mg/dL (calc) (ref <130)
Total CHOL/HDL Ratio: 2.7 (calc) (ref <5.0)
Triglycerides: 100 mg/dL (ref <150)

## 2020-03-04 LAB — TSH: TSH: 2.45 mIU/L

## 2020-03-04 LAB — VITAMIN D 25 HYDROXY (VIT D DEFICIENCY, FRACTURES): Vit D, 25-Hydroxy: 18 ng/mL — ABNORMAL LOW (ref 30–100)

## 2020-03-07 ENCOUNTER — Encounter: Payer: Self-pay | Admitting: Obstetrics and Gynecology

## 2020-03-07 DIAGNOSIS — E559 Vitamin D deficiency, unspecified: Secondary | ICD-10-CM | POA: Insufficient documentation

## 2020-03-07 HISTORY — DX: Vitamin D deficiency, unspecified: E55.9

## 2020-03-10 ENCOUNTER — Other Ambulatory Visit: Payer: Self-pay

## 2020-03-10 DIAGNOSIS — E559 Vitamin D deficiency, unspecified: Secondary | ICD-10-CM

## 2020-03-26 ENCOUNTER — Telehealth: Payer: Self-pay | Admitting: Physician Assistant

## 2020-03-26 ENCOUNTER — Other Ambulatory Visit: Payer: Self-pay | Admitting: Physician Assistant

## 2020-03-26 MED ORDER — AMPHETAMINE-DEXTROAMPHET ER 25 MG PO CP24: 25.0000 mg | ORAL_CAPSULE | ORAL | 0 refills | Status: DC

## 2020-03-26 NOTE — Telephone Encounter (Signed)
Prescription was sent

## 2020-03-26 NOTE — Telephone Encounter (Signed)
Pt requesting Rx for Adderall @ [facility] [address]

## 2020-04-18 ENCOUNTER — Ambulatory Visit (INDEPENDENT_AMBULATORY_CARE_PROVIDER_SITE_OTHER): Payer: 59 | Admitting: Physician Assistant

## 2020-04-18 ENCOUNTER — Encounter: Payer: Self-pay | Admitting: Physician Assistant

## 2020-04-18 ENCOUNTER — Other Ambulatory Visit: Payer: Self-pay

## 2020-04-18 DIAGNOSIS — F900 Attention-deficit hyperactivity disorder, predominantly inattentive type: Secondary | ICD-10-CM | POA: Diagnosis not present

## 2020-04-18 MED ORDER — AMPHETAMINE-DEXTROAMPHET ER 25 MG PO CP24: 25.0000 mg | ORAL_CAPSULE | ORAL | 0 refills | Status: DC

## 2020-04-18 MED ORDER — AMPHETAMINE-DEXTROAMPHETAMINE 5 MG PO TABS: 5.0000 mg | ORAL_TABLET | Freq: Every day | ORAL | 0 refills | Status: DC

## 2020-04-18 NOTE — Progress Notes (Signed)
[facility]  Patient ID: Brittany Hart,  MRN: [id number]  PCP: Carilyn Goodpasture, PA-C  Date of Evaluation: 04/18/2020 Time spent:20 minutes  Chief Complaint:  Chief Complaint    ADD; Follow-up      HISTORY/CURRENT STATUS: HPI for routine med check.  Last seen 6 months ago and has been doing well in the interim.  Able to focus and get things done in a timely manner.  Does not get distracted easily.  Work is going well although busy.  Often times in the afternoon she does have trouble focusing as the Adderall XR wears off.  She is able to enjoy things.  Energy and motivation are good.  Not isolating.  She does not cry easily.  Appetite and weight are stable.  Personal hygiene is normal.  No suicidal or homicidal thoughts.  Denies dizziness, syncope, seizures, numbness, tremor, tics, unsteady gait, slurred speech, confusion. Denies muscle or joint pain, stiffness, or dystonia.  Individual Medical History/ Review of Systems: Changes? :No    Past medications for mental health diagnoses include: none  Allergies: Patient has no known allergies.  Current Medications:  Current Outpatient Medications:  .  [START ON 05/22/2020] amphetamine-dextroamphetamine (ADDERALL XR) 25 MG 24 hr capsule, Take 1 capsule by mouth every morning., Disp: 30 capsule, Rfl: 0 .  amphetamine-dextroamphetamine (ADDERALL XR) 25 MG 24 hr capsule, Take 1 capsule by mouth every morning., Disp: 30 capsule, Rfl: 0 .  amphetamine-dextroamphetamine (ADDERALL) 5 MG tablet, Take 1 tablet (5 mg total) by mouth daily in the afternoon. No later than 4:00 prm, as needed., Disp: 30 tablet, Rfl: 0 .  norethindrone-ethinyl estradiol (JUNEL FE 1/20) 1-20 MG-MCG tablet, Take 1 tablet by mouth daily., Disp: 84 tablet, Rfl: 3 .  [START ON 06/20/2020] amphetamine-dextroamphetamine (ADDERALL XR) 25 MG 24 hr capsule, Take 1 capsule by mouth every morning., Disp: 30 capsule, Rfl: 0 Medication Side Effects: none  Family  Medical/ Social History: Changes? No  MENTAL HEALTH EXAM:  There were no vitals taken for this visit.There is no height or weight on file to calculate BMI.  General Appearance: Casual, Neat and Well Groomed  Eye Contact:  Good  Speech:  Clear and Coherent  Volume:  Normal  Mood:  Euthymic  Affect:  Appropriate  Thought Process:  Goal Directed and Descriptions of Associations: Intact  Orientation:  Full (Time, Place, and Person)  Thought Content: Logical   Suicidal Thoughts:  No  Homicidal Thoughts:  No  Memory:  WNL  Judgement:  Good  Insight:  Good  Psychomotor Activity:  Normal  Concentration:  Concentration: Good and Attention Span: Good  Recall:  Good  Fund of Knowledge: Good  Language: Good  Assets:  Desire for Improvement  ADL's:  Intact  Cognition: WNL  Prognosis:  Good    DIAGNOSES:    ICD-10-CM   1. Attention deficit hyperactivity disorder (ADHD), predominantly inattentive type  F90.0     Receiving Psychotherapy: Yes With Zoila Shutter   RECOMMENDATIONS:  PDMP was reviewed. I provided 20 minutes of nonface-to-face time during this encounter. Continue Adderall XR 25 mg, 1 p.o. every morning. Start Adderall 5 mg q. afternoon as needed.  If this works well she can call and I will send in more prescriptions. Continue therapy with Zoila Shutter. Return in 6 months    Melony Overly, [place]

## 2020-07-28 ENCOUNTER — Other Ambulatory Visit: Payer: Self-pay

## 2020-07-28 ENCOUNTER — Telehealth: Payer: Self-pay | Admitting: Physician Assistant

## 2020-07-28 MED ORDER — AMPHETAMINE-DEXTROAMPHET ER 25 MG PO CP24: 25.0000 mg | ORAL_CAPSULE | ORAL | 0 refills | Status: DC

## 2020-07-28 NOTE — Telephone Encounter (Signed)
Patient Brittany Hart requesting a refill on the Adderall. A follow up appointment is scheduled for 9/8.

## 2020-07-28 NOTE — Telephone Encounter (Signed)
Pended.

## 2020-10-09 ENCOUNTER — Ambulatory Visit: Payer: 59 | Admitting: Physician Assistant

## 2020-10-09 ENCOUNTER — Encounter: Payer: Self-pay | Admitting: Physician Assistant

## 2020-10-09 ENCOUNTER — Other Ambulatory Visit: Payer: Self-pay

## 2020-10-09 DIAGNOSIS — F900 Attention-deficit hyperactivity disorder, predominantly inattentive type: Secondary | ICD-10-CM

## 2020-10-09 DIAGNOSIS — F418 Other specified anxiety disorders: Secondary | ICD-10-CM

## 2020-10-09 MED ORDER — AMPHETAMINE-DEXTROAMPHET ER 25 MG PO CP24: 25.0000 mg | ORAL_CAPSULE | ORAL | 0 refills | Status: DC

## 2020-10-09 MED ORDER — AMPHETAMINE-DEXTROAMPHETAMINE 10 MG PO TABS: 5.0000 mg | ORAL_TABLET | Freq: Every day | ORAL | 0 refills | Status: DC

## 2020-10-09 NOTE — Progress Notes (Signed)
[facility]  Patient ID: Brittany Hart,  MRN: [id number]  PCP: Carilyn Goodpasture, PA-C  Date of Evaluation: 10/09/2020 Time spent:20 minutes  Chief Complaint:  Chief Complaint   ADHD; Follow-up      HISTORY/CURRENT STATUS: HPI for routine med check.  Work is stressful, she is a Manufacturing systems engineer.  She is able to focus and get things done in a timely manner.  Feels like the Adderall is working, wonders if having a slightly higher dose for afternoon use as needed would be helpful or not.  The XR is effective in the mornings but she notices it wearing off pretty quickly in the afternoon.    Patient denies loss of interest in usual activities and is able to enjoy things.  Denies decreased energy or motivation.  Appetite has not changed.  No extreme sadness, tearfulness, or feelings of hopelessness.  She does get anxious depending on the circumstances, does not affect her day-to-day life.  Denies suicidal or homicidal thoughts.  Denies dizziness, syncope, seizures, numbness, tingling, tremor, tics, unsteady gait, slurred speech, confusion. Denies muscle or joint pain, stiffness, or dystonia.  Individual Medical History/ Review of Systems: Changes? :No    Past medications for mental health diagnoses include: none  Allergies: Patient has no known allergies.  Current Medications:  Current Outpatient Medications:    amphetamine-dextroamphetamine (ADDERALL) 10 MG tablet, Take 0.5-1 tablets (5-10 mg total) by mouth daily in the afternoon., Disp: 30 tablet, Rfl: 0   [START ON 12/27/2020] amphetamine-dextroamphetamine (ADDERALL XR) 25 MG 24 hr capsule, Take 1 capsule by mouth every morning., Disp: 30 capsule, Rfl: 0   [START ON 11/28/2020] amphetamine-dextroamphetamine (ADDERALL XR) 25 MG 24 hr capsule, Take 1 capsule by mouth every morning., Disp: 30 capsule, Rfl: 0   [START ON 10/30/2020] amphetamine-dextroamphetamine (ADDERALL XR) 25 MG 24 hr capsule, Take 1 capsule by mouth  every morning., Disp: 30 capsule, Rfl: 0   norethindrone-ethinyl estradiol (JUNEL FE 1/20) 1-20 MG-MCG tablet, Take 1 tablet by mouth daily., Disp: 84 tablet, Rfl: 3 Medication Side Effects: none  Family Medical/ Social History: Changes? No  MENTAL HEALTH EXAM:  There were no vitals taken for this visit.There is no height or weight on file to calculate BMI.  General Appearance: Casual, Neat and Well Groomed  Eye Contact:  Good  Speech:  Clear and Coherent  Volume:  Normal  Mood:  Euthymic  Affect:  Appropriate  Thought Process:  Goal Directed and Descriptions of Associations: Circumstantial  Orientation:  Full (Time, Place, and Person)  Thought Content: Logical   Suicidal Thoughts:  No  Homicidal Thoughts:  No  Memory:  WNL  Judgement:  Good  Insight:  Good  Psychomotor Activity:  Normal  Concentration:  Concentration: Good and Attention Span: Good  Recall:  Good  Fund of Knowledge: Good  Language: Good  Assets:  Desire for Improvement  ADL's:  Intact  Cognition: WNL  Prognosis:  Good    DIAGNOSES:    ICD-10-CM   1. Attention deficit hyperactivity disorder (ADHD), predominantly inattentive type  F90.0     2. Situational anxiety  F41.8        Receiving Psychotherapy: Yes With Zoila Shutter   RECOMMENDATIONS:  PDMP was reviewed.  Last Adderall filled 09/30/2020 I provided 20 minutes of face to face time during this encounter, including time spent before and after the visit in records review, medical decision making, and charting.  She is doing well for the most part but the Adderall XR does wear  off in the mid afternoon or so.  There are days when she works 12 hours so needs the coverage to be longer than 6 or 8.  Will increase the Adderall IR dose and she can take 1/2 to 1/day in the afternoon as needed. Continue Adderall XR 25 mg, 1 p.o. every morning. Increase Adderall 10 mg q. afternoon as needed.  If this works well she can call and I will send in more  prescriptions. Continue therapy with Zoila Shutter. Return in 6 months   Melony Overly, [place]

## 2021-01-02 ENCOUNTER — Telehealth: Payer: Self-pay | Admitting: Physician Assistant

## 2021-01-02 NOTE — Telephone Encounter (Signed)
Pt said she was only able to get 15 pills of Adderall.  Since she wasn't able to get all 30 pills, she wants a new script sent for the remaining 15 pills to be sent to [facility] [address]  Once they have them in stock, she will be able to get them.  Next appt 3/6

## 2021-01-16 ENCOUNTER — Telehealth: Payer: Self-pay | Admitting: Physician Assistant

## 2021-01-16 ENCOUNTER — Other Ambulatory Visit: Payer: Self-pay | Admitting: Physician Assistant

## 2021-01-16 MED ORDER — AMPHETAMINE-DEXTROAMPHET ER 25 MG PO CP24: 25.0000 mg | ORAL_CAPSULE | ORAL | 0 refills | Status: DC

## 2021-01-16 NOTE — Telephone Encounter (Signed)
Prescription was sent

## 2021-01-16 NOTE — Telephone Encounter (Signed)
Awaiting response from teresa

## 2021-01-16 NOTE — Telephone Encounter (Signed)
Addendum to previous message on 01/02/21. Brittany Hart called to get an update if the Adderall #15 pills has been sent to the pharmacy. Don't see that the message was replied to.

## 2021-01-16 NOTE — Telephone Encounter (Signed)
Please see message dated 12/2 I just sent to you

## 2021-01-16 NOTE — Telephone Encounter (Signed)
Brittany Hart and they have the Adderall 15 mg in stock. Please call her refill in at this location.   [address] [place], [place] [address] Phone number is [phone].

## 2021-02-03 ENCOUNTER — Other Ambulatory Visit: Payer: Self-pay | Admitting: Physician Assistant

## 2021-02-03 MED ORDER — AMPHETAMINE-DEXTROAMPHET ER 25 MG PO CP24: 25.0000 mg | ORAL_CAPSULE | ORAL | 0 refills | Status: DC

## 2021-02-03 NOTE — Telephone Encounter (Signed)
[facility]: [id number] [place] [place] - [address]

## 2021-02-04 ENCOUNTER — Telehealth: Payer: Self-pay | Admitting: Physician Assistant

## 2021-02-04 NOTE — Telephone Encounter (Signed)
Pt called and said that she needs a PA on her adderall. She has new Editor, commissioning which I just added. We don't have her new card on file. Her number is [phone]

## 2021-02-05 NOTE — Telephone Encounter (Signed)
PA still pending at this time.

## 2021-02-05 NOTE — Telephone Encounter (Signed)
Noted will send PA 

## 2021-02-08 DIAGNOSIS — R1013 Epigastric pain: Secondary | ICD-10-CM | POA: Diagnosis not present

## 2021-02-09 ENCOUNTER — Telehealth: Payer: Self-pay | Admitting: Physician Assistant

## 2021-02-09 NOTE — Telephone Encounter (Signed)
Adderall XR 25mg  Prior Approval # Brittany Hart ( 02/09/21-02/09/2022) ---Pt called to check status of her PA for Adderall XR. Showed still pending on Cover My Meds. I called [employer] to check status and they said that the PA was cancelled ( not sure why). Re-initiated the PA over the phone with ADHD dx.

## 2021-02-09 NOTE — Telephone Encounter (Signed)
Great thanks so much for following up

## 2021-02-09 NOTE — Telephone Encounter (Signed)
See other phone message. approved

## 2021-02-18 ENCOUNTER — Other Ambulatory Visit: Payer: Self-pay

## 2021-02-18 DIAGNOSIS — R1013 Epigastric pain: Secondary | ICD-10-CM | POA: Diagnosis not present

## 2021-02-18 DIAGNOSIS — K219 Gastro-esophageal reflux disease without esophagitis: Secondary | ICD-10-CM | POA: Diagnosis not present

## 2021-02-20 DIAGNOSIS — R1013 Epigastric pain: Secondary | ICD-10-CM | POA: Diagnosis not present

## 2021-02-20 DIAGNOSIS — K219 Gastro-esophageal reflux disease without esophagitis: Secondary | ICD-10-CM | POA: Diagnosis not present

## 2021-02-24 ENCOUNTER — Ambulatory Visit
Admission: RE | Admit: 2021-02-24 | Discharge: 2021-02-24 | Disposition: A | Discharge status: Home / Self Care | Payer: BC Managed Care – PPO | Source: Ambulatory Visit | Attending: Gastroenterology | Admitting: Gastroenterology

## 2021-02-24 DIAGNOSIS — K219 Gastro-esophageal reflux disease without esophagitis: Secondary | ICD-10-CM | POA: Diagnosis not present

## 2021-02-24 DIAGNOSIS — K7689 Other specified diseases of liver: Secondary | ICD-10-CM | POA: Diagnosis not present

## 2021-02-24 NOTE — Progress Notes (Deleted)
41 y.o. G105P2002 Married White or Caucasian Not Hispanic or Latino female here for annual exam.      No LMP recorded.          Sexually active: {yes no:314532}  The current method of family planning is {contraception:315051}.    Exercising: {yes no:314532}  {types:19826} Smoker:  {YES NO:22349}  Health Maintenance: Pap:   10/13/16 Neg:Neg HR HPV             12/11/12 Neg History of abnormal Pap:  yes in 2000 f/u was normal  MMG:  none  BMD:   none  Colonoscopy: none  TDaP:  12/21/12  Gardasil: never    reports that she has never smoked. She has never used smokeless tobacco. She reports that she does not currently use alcohol. She reports that she does not use drugs.  Past Medical History:  Diagnosis Date   ADD (attention deficit disorder)    Anxiety    Kidney stone    Renal calculus or stone    CURRENT   Vitamin D deficiency 03/07/2020    Past Surgical History:  Procedure Laterality Date   ESOPHAGEAL DILATION     URETEROSCOPY  06/04/2011   Procedure: URETEROSCOPY;  Surgeon: Garnett Farm, MD;  Location: [facility];  Service: Urology;  Laterality: Left;  cystoscopy, Left ureteroscopy with lithotripsy holmium laser    Current Outpatient Medications  Medication Sig Dispense Refill   amphetamine-dextroamphetamine (ADDERALL XR) 25 MG 24 hr capsule Take 1 capsule by mouth every morning. 30 capsule 0   amphetamine-dextroamphetamine (ADDERALL XR) 25 MG 24 hr capsule Take 1 capsule by mouth every morning. 30 capsule 0   amphetamine-dextroamphetamine (ADDERALL XR) 25 MG 24 hr capsule Take 1 capsule by mouth every morning. 15 capsule 0   amphetamine-dextroamphetamine (ADDERALL XR) 25 MG 24 hr capsule Take 1 capsule by mouth every morning. 30 capsule 0   amphetamine-dextroamphetamine (ADDERALL) 10 MG tablet Take 0.5-1 tablets (5-10 mg total) by mouth daily in the afternoon. 30 tablet 0   norethindrone-ethinyl estradiol (JUNEL FE 1/20) 1-20 MG-MCG tablet Take 1 tablet by  mouth daily. 84 tablet 3   No current facility-administered medications for this visit.    Family History  Problem Relation Age of Onset   Spondylitis Mother    Bronchiolitis Mother    Depression Mother    Anxiety disorder Father    Depression Father    GER disease Father    Seizures Father    Breast cancer Maternal Grandmother    Hypertension Maternal Grandmother    Depression Maternal Grandmother    Anxiety disorder Maternal Grandmother    Dementia Maternal Grandmother    Breast cancer Paternal Grandmother    Kidney cancer Maternal Grandfather    CVA Paternal Grandfather    ADD / ADHD Son    Healthy Son     Review of Systems  Exam:   There were no vitals taken for this visit.  Weight change: @WEIGHTCHANGE @ Height:      Ht Readings from Last 3 Encounters:  03/03/20 5' 3.25" (1.607 m)  02/28/19 5' 3.25" (1.607 m)  10/17/17 5\' 3"  (1.6 m)    General appearance: alert, cooperative and appears stated age Head: Normocephalic, without obvious abnormality, atraumatic Neck: no adenopathy, supple, symmetrical, trachea midline and thyroid {CHL AMB PHY EX THYROID NORM [phone]::"normal to inspection and palpation"} Lungs: clear to auscultation bilaterally Cardiovascular: regular rate and rhythm Breasts: {Exam; breast:13139::"normal appearance, no masses or tenderness"} Abdomen: soft, non-tender; non distended,  no masses,  no organomegaly Extremities: extremities normal, atraumatic, no cyanosis or edema Skin: Skin color, texture, turgor normal. No rashes or lesions Lymph nodes: Cervical, supraclavicular, and axillary nodes normal. No abnormal inguinal nodes palpated Neurologic: Grossly normal   Pelvic: External genitalia:  no lesions              Urethra:  normal appearing urethra with no masses, tenderness or lesions              Bartholins and Skenes: normal                 Vagina: normal appearing vagina with normal color and discharge, no lesions               Cervix: {CHL AMB PHY EX CERVIX NORM DEFAULT:[id number]::"no lesions"}               Bimanual Exam:  Uterus:  {CHL AMB PHY EX UTERUS NORM [phone]::"normal size, contour, position, consistency, mobility, non-tender"}              Adnexa: {CHL AMB PHY EX ADNEXA NO MASS [phone]::"no mass, fullness, tenderness"}               Rectovaginal: Confirms               Anus:  normal sphincter tone, no lesions  *** chaperoned for the exam.  A:  Well Woman with normal exam  P:

## 2021-02-25 DIAGNOSIS — H1033 Unspecified acute conjunctivitis, bilateral: Secondary | ICD-10-CM | POA: Diagnosis not present

## 2021-02-25 DIAGNOSIS — R0981 Nasal congestion: Secondary | ICD-10-CM | POA: Diagnosis not present

## 2021-02-25 DIAGNOSIS — R059 Cough, unspecified: Secondary | ICD-10-CM | POA: Diagnosis not present

## 2021-03-04 ENCOUNTER — Ambulatory Visit: Payer: Managed Care, Other (non HMO) | Admitting: Obstetrics and Gynecology

## 2021-03-12 ENCOUNTER — Telehealth: Payer: Self-pay | Admitting: Physician Assistant

## 2021-03-12 NOTE — Telephone Encounter (Signed)
Yes I got you message.She did not answer so waiting for her to call back

## 2021-03-12 NOTE — Telephone Encounter (Signed)
Yes, please. Did you get my note about the Adzenys, what I tell my patients, and the smart phrase you can put in your note?  If not please let me know.  I hope it will make it easier on you.  Thank you.

## 2021-03-12 NOTE — Telephone Encounter (Signed)
Pt LVM asking if she can come pick up a paper script for the Adderall and just drive around to see who has it.    Next appt 3/6

## 2021-03-12 NOTE — Telephone Encounter (Signed)
Should I suggest adzenys?

## 2021-03-13 ENCOUNTER — Other Ambulatory Visit: Payer: Self-pay | Admitting: Physician Assistant

## 2021-03-13 MED ORDER — ADZENYS XR-ODT 15.7 MG PO TBED: 15.7000 mg | EXTENDED_RELEASE_TABLET | Freq: Every day | ORAL | 0 refills | Status: DC

## 2021-03-13 NOTE — Telephone Encounter (Signed)
She would like to try it

## 2021-03-13 NOTE — Telephone Encounter (Signed)
Adzenys XR ODT prescription sent to [facility]

## 2021-04-06 ENCOUNTER — Other Ambulatory Visit: Payer: Self-pay

## 2021-04-06 ENCOUNTER — Ambulatory Visit: Payer: BC Managed Care – PPO | Admitting: Physician Assistant

## 2021-04-06 ENCOUNTER — Other Ambulatory Visit: Payer: Self-pay | Admitting: Obstetrics and Gynecology

## 2021-04-06 ENCOUNTER — Encounter: Payer: Self-pay | Admitting: Physician Assistant

## 2021-04-06 DIAGNOSIS — F418 Other specified anxiety disorders: Secondary | ICD-10-CM

## 2021-04-06 DIAGNOSIS — F900 Attention-deficit hyperactivity disorder, predominantly inattentive type: Secondary | ICD-10-CM | POA: Diagnosis not present

## 2021-04-06 DIAGNOSIS — Z3041 Encounter for surveillance of contraceptive pills: Secondary | ICD-10-CM

## 2021-04-06 MED ORDER — AMPHETAMINE-DEXTROAMPHETAMINE 10 MG PO TABS: 5.0000 mg | ORAL_TABLET | Freq: Every day | ORAL | 0 refills | Status: DC

## 2021-04-06 MED ORDER — AMPHETAMINE-DEXTROAMPHET ER 25 MG PO CP24: 25.0000 mg | ORAL_CAPSULE | ORAL | 0 refills | Status: DC

## 2021-04-06 NOTE — Progress Notes (Signed)
[facility] ? ?Patient ID: Brittany Hart,  ?MRN: [id number] ? ?PCP: Carilyn Goodpasture, PA-C ? ?Date of Evaluation: 04/06/2021  ?time spent:20 minutes ? ?Chief Complaint:  ?Chief Complaint   ?ADHD; Follow-up ?  ? ? ?HISTORY/CURRENT STATUS: ?HPI for routine med check. ? ?Complains of fatigue. Was dx with B 12 def and treated w/ IM replacement and felt better, but then was put on oral, feels like she's getting more tired again. But she also had to change Adderall to Adzenys XR ODT and it has not worked as well as the Adderall.  So there are a lot of factors that could be causing the fatigue. ? ?Patient denies loss of interest in usual activities and is able to enjoy things.  Denies decreased energy or motivation.  Appetite has not changed.  No extreme sadness, tearfulness, or feelings of hopelessness.  She does get anxious depending on the circumstances, seems to be worse since she has not been on the Adderall XR.  Not really sure why.  Denies suicidal or homicidal thoughts. ? ?Denies dizziness, syncope, seizures, numbness, tingling, tremor, tics, unsteady gait, slurred speech, confusion. Denies muscle or joint pain, stiffness, or dystonia. ? ?Individual Medical History/ Review of Systems: Changes? :No   ? ?Past medications for mental health diagnoses include: ?none ? ?Allergies: Patient has no known allergies. ? ?Current Medications:  ?Current Outpatient Medications:  ?  Amphetamine ER (ADZENYS XR-ODT) 15.7 MG TBED, Take 15.7 mg by mouth daily after breakfast., Disp: 30 tablet, Rfl: 0 ?  [START ON 04/09/2021] amphetamine-dextroamphetamine (ADDERALL XR) 25 MG 24 hr capsule, Take 1 capsule by mouth every morning., Disp: 30 capsule, Rfl: 0 ?  norethindrone-ethinyl estradiol (JUNEL FE 1/20) 1-20 MG-MCG tablet, Take 1 tablet by mouth daily., Disp: 84 tablet, Rfl: 3 ?  vitamin B-12 (CYANOCOBALAMIN) 500 MCG tablet, TAKE 1 TABLET BY MOUTH EVERY DAY FOR 30 DAYS, Disp: , Rfl:  ?  amphetamine-dextroamphetamine  (ADDERALL) 10 MG tablet, Take 0.5-1 tablets (5-10 mg total) by mouth daily in the afternoon., Disp: 30 tablet, Rfl: 0 ?Medication Side Effects: none ? ?Family Medical/ Social History: Changes? No ? ?MENTAL HEALTH EXAM: ? ?There were no vitals taken for this visit.There is no height or weight on file to calculate BMI.  ?General Appearance: Casual, Neat and Well Groomed  ?Eye Contact:  Good  ?Speech:  Clear and Coherent  ?Volume:  Normal  ?Mood:  Euthymic  ?Affect:  Appropriate  ?Thought Process:  Goal Directed and Descriptions of Associations: Circumstantial  ?Orientation:  Full (Time, Place, and Person)  ?Thought Content: Logical   ?Suicidal Thoughts:  No  ?Homicidal Thoughts:  No  ?Memory:  WNL  ?Judgement:  Good  ?Insight:  Good  ?Psychomotor Activity:  Normal  ?Concentration:  Concentration: Fair and Attention Span: Good  ?Recall:  Good  ?Fund of Knowledge: Good  ?Language: Good  ?Assets:  Desire for Improvement  ?ADL's:  Intact  ?Cognition: WNL  ?Prognosis:  Good  ? ? ?DIAGNOSES:  ?  ICD-10-CM   ?1. Attention deficit hyperactivity disorder (ADHD), predominantly inattentive type  F90.0   ?  ?2. Situational anxiety  F41.8   ?  ? ? ? ? ?Receiving Psychotherapy: No  ? ? ?RECOMMENDATIONS:  ?PDMP was reviewed.  Last Adzenys XR ODT filled 03/13/2021. ?I provided 20 minutes of face to face time during this encounter, including time spent before and after the visit in records review, medical decision making, counseling pertinent to today's visit, and charting.  ?Recommend changing back to  the Adderall XR from the Adzenys XR ODT, since it is not working as well.  Hopefully she can find it. ? ?Discontinue Adzenys XR ODT. ?Restart Adderall XR 25 mg, 1 p.o. every morning. ?Continue Adderall 10 mg q. afternoon as needed.  ?Return in 6 months ? ? Melony Overly, PA-C  ?

## 2021-04-06 NOTE — Telephone Encounter (Signed)
Med refill request: Junel Fe 1/20 ?Last AEX: 03/03/20, JJ ?Next AEX: 05/25/21, JJ ?Last MMG (if hormonal med): None ? ?Last sent 03/03/20, #84/ 3RF ? ?Rx pended for 90 day supply.  ? ?Dr. Talbert Nan : Please Advise? ? ?

## 2021-04-07 ENCOUNTER — Telehealth: Payer: Self-pay | Admitting: Physician Assistant

## 2021-04-07 NOTE — Telephone Encounter (Signed)
Pt lvm stating that the adderall 10 mg that was sent in she is not able to pick up due to needing an authorization.  Please get this taken care of, Her phone number is [phone] ?

## 2021-04-08 NOTE — Telephone Encounter (Signed)
Her prior authorization has been approved according to [facility] [id number] Date:03/08/2021;Coverage End Date:04/07/2022; ?

## 2021-04-11 DIAGNOSIS — L089 Local infection of the skin and subcutaneous tissue, unspecified: Secondary | ICD-10-CM | POA: Diagnosis not present

## 2021-04-16 DIAGNOSIS — M79644 Pain in right finger(s): Secondary | ICD-10-CM | POA: Diagnosis not present

## 2021-04-27 DIAGNOSIS — M79644 Pain in right finger(s): Secondary | ICD-10-CM | POA: Insufficient documentation

## 2021-04-30 ENCOUNTER — Telehealth: Payer: Self-pay | Admitting: Physician Assistant

## 2021-04-30 ENCOUNTER — Other Ambulatory Visit: Payer: Self-pay | Admitting: Physician Assistant

## 2021-04-30 MED ORDER — AMPHETAMINE-DEXTROAMPHET ER 25 MG PO CP24: 25.0000 mg | ORAL_CAPSULE | ORAL | 0 refills | Status: DC

## 2021-04-30 NOTE — Telephone Encounter (Signed)
Prescription was sent

## 2021-04-30 NOTE — Telephone Encounter (Signed)
Adderall RF ?

## 2021-04-30 NOTE — Telephone Encounter (Addendum)
Pt requesting Adderall XR 25 mg 1/d either generic or brand. She needs to pick up on 4/7 leaving on 4/8. Pt # [phone]  [facility] [address] ?

## 2021-05-19 NOTE — Progress Notes (Signed)
41 y.o. G75P2002 Married White or Caucasian Not Hispanic or Latino female here for annual exam.  She is on OCP's for cycle control. No dyspareunia.  ?Period Cycle (Days): 28 ?Period Duration (Days): 5 ?Period Pattern: Regular ?Menstrual Flow: Moderate ?Menstrual Control: Panty liner ?Dysmenorrhea: (!) Mild ?Dysmenorrhea Symptoms: Cramping ? ?She is struggling with her weight. She is working on her eating habits. Drink lots of water. In the past she lost 100 lbs on her own. Can't seem to do it now.  ? ?She is on the waiting list to go to the weight loss clinic.  ? ?Her primary diagnosed her with B12 def and started her on supplements. ? 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  ? ?Patient's last menstrual period was 05/11/2021.          ?Sexually active: Yes.    ?The current method of family planning is vasectomy.    ?Exercising: No.     ?Smoker:  no ? ?Health Maintenance: ?Pap:   10/13/16 Neg:Neg HR HPV  12/11/12 WNL  ?History of abnormal Pap:  yes, in 2000, f/u normal  ?MMG: none   ?BMD:  none   ?Colonoscopy: none  ?TDaP:  12/11/12  ?Gardasil: never ? ? reports that she has never smoked. She has never used smokeless tobacco. She reports that she does not currently use alcohol. She reports that she does not use drugs. She is a Aeronautical engineer. 2 sons, one in HS and one in college.  ? ?Past Medical History:  ?Diagnosis Date  ? ADD (attention deficit disorder)   ? Anxiety   ? Kidney stone   ? Renal calculus or stone   ? CURRENT  ? Vitamin D deficiency 03/07/2020  ? ? ?Past Surgical History:  ?Procedure Laterality Date  ? ESOPHAGEAL DILATION    ? URETEROSCOPY  06/04/2011  ? Procedure: URETEROSCOPY;  Surgeon: Claybon Jabs, MD;  Location: [facility];  Service: Urology;  Laterality: Left;  cystoscopy, Left ureteroscopy with lithotripsy holmium laser  ? ? ?Current Outpatient Medications  ?Medication Sig Dispense Refill  ? amphetamine-dextroamphetamine (ADDERALL XR) 25 MG 24 hr  capsule Take 1 capsule by mouth every morning. 30 capsule 0  ? amphetamine-dextroamphetamine (ADDERALL) 10 MG tablet Take 0.5-1 tablets (5-10 mg total) by mouth daily in the afternoon. 30 tablet 0  ? JUNEL FE 1/20 1-20 MG-MCG tablet TAKE 1 TABLET BY MOUTH EVERY DAY 84 tablet 0  ? vitamin B-12 (CYANOCOBALAMIN) 500 MCG tablet TAKE 1 TABLET BY MOUTH EVERY DAY FOR 30 DAYS    ? VITAMIN D PO Take 1 tablet by mouth daily.    ? ?No current facility-administered medications for this visit.  ? ? ?Family History  ?Problem Relation Age of Onset  ? Spondylitis Mother   ? Bronchiolitis Mother   ? Depression Mother   ? Anxiety disorder Father   ? Depression Father   ? GER disease Father   ? Seizures Father   ? Breast cancer Maternal Grandmother   ? Hypertension Maternal Grandmother   ? Depression Maternal Grandmother   ? Anxiety disorder Maternal Grandmother   ? Dementia Maternal Grandmother   ? Breast cancer Paternal Grandmother   ? Kidney cancer Maternal Grandfather   ? CVA Paternal Grandfather   ? ADD / ADHD Son   ? Healthy Son   ? ? ?Review of Systems  ?All other systems reviewed and are negative. ? ?Exam:   ?BP 120/80 (BP Location: Right Arm,  Patient Position: Sitting, Cuff Size: Normal)   Ht 5\' 3"  (1.6 m)   Wt 214 lb (97.1 kg)   LMP 05/11/2021   BMI 37.91 kg/m?   Weight change: @WEIGHTCHANGE @ Height:   Height: 5\' 3"  (160 cm)  ?Ht Readings from Last 3 Encounters:  ?05/25/21 5\' 3"  (1.6 m)  ?03/03/20 5' 3.25" (1.607 m)  ?02/28/19 5' 3.25" (1.607 m)  ? ? ?General appearance: alert, cooperative and appears stated age ?Head: Normocephalic, without obvious abnormality, atraumatic ?Neck: no adenopathy, supple, symmetrical, trachea midline and thyroid normal to inspection and palpation ?Lungs: clear to auscultation bilaterally ?Cardiovascular: regular rate and rhythm ?Breasts: normal appearance, no masses or tenderness ?Abdomen: soft, non-tender; non distended,  no masses,  no organomegaly ?Extremities: extremities normal,  atraumatic, no cyanosis or edema ?Skin: Skin color, texture, turgor normal. No rashes or lesions ?Lymph nodes: Cervical, supraclavicular, and axillary nodes normal. ?No abnormal inguinal nodes palpated ?Neurologic: Grossly normal ? ? ?Pelvic: External genitalia:  no lesions ?             Urethra:  normal appearing urethra with no masses, tenderness or lesions ?             Bartholins and Skenes: normal    ?             Vagina: normal appearing vagina with normal color and discharge, no lesions ?             Cervix: no lesions and friable with pap ?              ?Bimanual Exam:  Uterus:   no masses or tenderness ?             Adnexa: no mass, fullness, tenderness ?              Rectovaginal: Confirms ?              Anus:  normal sphincter tone, no lesions ? ?Wandra Scot, RMA chaperoned for the exam. ? ? ?1. Well woman exam ?Discussed breast self exam ?Discussed calcium and vit D intake ?Mammogram due, # given to schedule ?Labs with primary ? ?2. Screening for cervical cancer ?- Cytology - PAP ? ?3. Encounter for surveillance of contraceptive pills ?On for cycle control. She has had an occular migraine so needs to go off OCP's ? ?4. General counseling and advice on female contraception ?On OCP's for cycle control, will change to micronor ?- norethindrone (ORTHO MICRONOR) 0.35 MG tablet; Take 1 tablet (0.35 mg total) by mouth daily.  Dispense: 84 tablet; Refill: 3 ? ?5. BMI 37.0-37.9, adult ?Discussed eating healthy, exercise and option for the weight loss clinic ? ?

## 2021-05-25 ENCOUNTER — Other Ambulatory Visit (HOSPITAL_COMMUNITY)
Admission: RE | Admit: 2021-05-25 | Discharge: 2021-05-25 | Disposition: A | Discharge status: Home / Self Care | Payer: BC Managed Care – PPO | Source: Ambulatory Visit | Attending: Obstetrics and Gynecology | Admitting: Obstetrics and Gynecology

## 2021-05-25 ENCOUNTER — Encounter: Payer: Self-pay | Admitting: Obstetrics and Gynecology

## 2021-05-25 ENCOUNTER — Ambulatory Visit (INDEPENDENT_AMBULATORY_CARE_PROVIDER_SITE_OTHER): Payer: BC Managed Care – PPO | Admitting: Obstetrics and Gynecology

## 2021-05-25 VITALS — BP 120/80 | Ht 63.0 in | Wt 214.0 lb

## 2021-05-25 DIAGNOSIS — Z124 Encounter for screening for malignant neoplasm of cervix: Secondary | ICD-10-CM | POA: Insufficient documentation

## 2021-05-25 DIAGNOSIS — Z01419 Encounter for gynecological examination (general) (routine) without abnormal findings: Secondary | ICD-10-CM

## 2021-05-25 DIAGNOSIS — Z6837 Body mass index (BMI) 37.0-37.9, adult: Secondary | ICD-10-CM

## 2021-05-25 DIAGNOSIS — Z3041 Encounter for surveillance of contraceptive pills: Secondary | ICD-10-CM

## 2021-05-25 DIAGNOSIS — Z3009 Encounter for other general counseling and advice on contraception: Secondary | ICD-10-CM

## 2021-05-25 MED ORDER — NORETHINDRONE 0.35 MG PO TABS: 1.0000 | ORAL_TABLET | Freq: Every day | ORAL | 3 refills | Status: DC

## 2021-05-25 NOTE — Patient Instructions (Signed)

## 2021-05-26 LAB — CYTOLOGY - PAP
Comment: NEGATIVE
Diagnosis: NEGATIVE
High risk HPV: NEGATIVE

## 2021-05-27 DIAGNOSIS — J029 Acute pharyngitis, unspecified: Secondary | ICD-10-CM | POA: Diagnosis not present

## 2021-06-05 ENCOUNTER — Telehealth: Payer: Self-pay

## 2021-06-05 NOTE — Telephone Encounter (Signed)
Pt calling to inquire when she should start taking the POPs. Shes wondering if she should start taking before shes expected to start menses or after the bleeding stops? ? ?States she is supposed to start placebo pills of the OCP pack this Sunday 06/07/21 so she plans to just throw those out and still expect cycle.  ? ?Pt aware you are off today and will be back on Monday 06/08/21.  ? ?Please advise.  ? ? ?

## 2021-06-08 NOTE — Telephone Encounter (Signed)
Spoke with patient and informed her. °

## 2021-06-08 NOTE — Telephone Encounter (Signed)
She should start the minipill today and use back up for one week.  ?

## 2021-06-09 ENCOUNTER — Other Ambulatory Visit: Payer: Self-pay | Admitting: Physician Assistant

## 2021-06-09 ENCOUNTER — Telehealth: Payer: Self-pay | Admitting: Physician Assistant

## 2021-06-09 MED ORDER — AMPHETAMINE-DEXTROAMPHETAMINE 10 MG PO TABS: 5.0000 mg | ORAL_TABLET | Freq: Every day | ORAL | 0 refills | Status: DC

## 2021-06-09 MED ORDER — AMPHETAMINE-DEXTROAMPHET ER 25 MG PO CP24: 25.0000 mg | ORAL_CAPSULE | ORAL | 0 refills | Status: DC

## 2021-06-09 NOTE — Telephone Encounter (Signed)
Brittany Hart called to request refill of her Adderall.  Appt 10/07/21.  [facility] [address] ?

## 2021-06-09 NOTE — Telephone Encounter (Signed)
Prescriptions were sent

## 2021-06-10 DIAGNOSIS — K219 Gastro-esophageal reflux disease without esophagitis: Secondary | ICD-10-CM | POA: Diagnosis not present

## 2021-06-23 DIAGNOSIS — R5383 Other fatigue: Secondary | ICD-10-CM | POA: Diagnosis not present

## 2021-06-23 DIAGNOSIS — E538 Deficiency of other specified B group vitamins: Secondary | ICD-10-CM | POA: Diagnosis not present

## 2021-06-23 DIAGNOSIS — R0602 Shortness of breath: Secondary | ICD-10-CM | POA: Diagnosis not present

## 2021-06-23 DIAGNOSIS — F3289 Other specified depressive episodes: Secondary | ICD-10-CM | POA: Diagnosis not present

## 2021-06-23 DIAGNOSIS — E559 Vitamin D deficiency, unspecified: Secondary | ICD-10-CM | POA: Diagnosis not present

## 2021-06-23 DIAGNOSIS — R632 Polyphagia: Secondary | ICD-10-CM | POA: Diagnosis not present

## 2021-06-23 DIAGNOSIS — R7301 Impaired fasting glucose: Secondary | ICD-10-CM | POA: Diagnosis not present

## 2021-06-27 DIAGNOSIS — R350 Frequency of micturition: Secondary | ICD-10-CM | POA: Diagnosis not present

## 2021-06-27 DIAGNOSIS — R1030 Lower abdominal pain, unspecified: Secondary | ICD-10-CM | POA: Diagnosis not present

## 2021-07-07 DIAGNOSIS — R300 Dysuria: Secondary | ICD-10-CM | POA: Diagnosis not present

## 2021-07-07 DIAGNOSIS — R632 Polyphagia: Secondary | ICD-10-CM | POA: Diagnosis not present

## 2021-07-07 DIAGNOSIS — D519 Vitamin B12 deficiency anemia, unspecified: Secondary | ICD-10-CM | POA: Diagnosis not present

## 2021-07-07 DIAGNOSIS — E559 Vitamin D deficiency, unspecified: Secondary | ICD-10-CM | POA: Diagnosis not present

## 2021-07-10 ENCOUNTER — Other Ambulatory Visit: Payer: Self-pay

## 2021-07-10 ENCOUNTER — Telehealth: Payer: Self-pay | Admitting: Physician Assistant

## 2021-07-10 MED ORDER — AMPHETAMINE-DEXTROAMPHET ER 25 MG PO CP24: 25.0000 mg | ORAL_CAPSULE | ORAL | 0 refills | Status: DC

## 2021-07-10 NOTE — Telephone Encounter (Signed)
Pended.

## 2021-07-10 NOTE — Telephone Encounter (Signed)
Pt requesting Rx generic Adderall XR 25 mg 1/d Apt [address] [facility] [address]

## 2021-07-24 ENCOUNTER — Encounter (HOSPITAL_BASED_OUTPATIENT_CLINIC_OR_DEPARTMENT_OTHER): Payer: Self-pay | Admitting: Emergency Medicine

## 2021-07-24 ENCOUNTER — Other Ambulatory Visit: Payer: Self-pay

## 2021-07-24 ENCOUNTER — Emergency Department (HOSPITAL_BASED_OUTPATIENT_CLINIC_OR_DEPARTMENT_OTHER): Payer: BC Managed Care – PPO | Admitting: Radiology

## 2021-07-24 ENCOUNTER — Emergency Department (HOSPITAL_BASED_OUTPATIENT_CLINIC_OR_DEPARTMENT_OTHER)
Admission: EM | Admit: 2021-07-24 | Discharge: 2021-07-24 | Disposition: A | Discharge status: Home / Self Care | Payer: BC Managed Care – PPO | Attending: Emergency Medicine | Admitting: Emergency Medicine

## 2021-07-24 DIAGNOSIS — Z79899 Other long term (current) drug therapy: Secondary | ICD-10-CM | POA: Diagnosis not present

## 2021-07-24 DIAGNOSIS — M25512 Pain in left shoulder: Secondary | ICD-10-CM | POA: Diagnosis not present

## 2021-07-24 DIAGNOSIS — R9431 Abnormal electrocardiogram [ECG] [EKG]: Secondary | ICD-10-CM | POA: Diagnosis not present

## 2021-07-24 DIAGNOSIS — M7542 Impingement syndrome of left shoulder: Secondary | ICD-10-CM | POA: Diagnosis not present

## 2021-07-24 LAB — CBC
HCT: 42.1 % (ref 36.0–46.0)
Hemoglobin: 14.3 g/dL (ref 12.0–15.0)
MCH: 30.8 pg (ref 26.0–34.0)
MCHC: 34 g/dL (ref 30.0–36.0)
MCV: 90.5 fL (ref 80.0–100.0)
Platelets: 318 10*3/uL (ref 150–400)
RBC: 4.65 MIL/uL (ref 3.87–5.11)
RDW: 11.8 % (ref 11.5–15.5)
WBC: 9.2 10*3/uL (ref 4.0–10.5)
nRBC: 0 % (ref 0.0–0.2)

## 2021-07-24 LAB — BASIC METABOLIC PANEL
Anion gap: 9 (ref 5–15)
BUN: 11 mg/dL (ref 6–20)
CO2: 27 mmol/L (ref 22–32)
Calcium: 10 mg/dL (ref 8.9–10.3)
Chloride: 104 mmol/L (ref 98–111)
Creatinine, Ser: 0.78 mg/dL (ref 0.44–1.00)
GFR, Estimated: >60 mL/min (ref >60)
Glucose, Bld: 123 mg/dL — ABNORMAL HIGH (ref 70–99)
Potassium: 4 mmol/L (ref 3.5–5.1)
Sodium: 140 mmol/L (ref 135–145)

## 2021-07-24 LAB — PREGNANCY, URINE: Preg Test, Ur: NEGATIVE

## 2021-07-24 LAB — TROPONIN I (HIGH SENSITIVITY): Troponin I (High Sensitivity): 3 ng/L (ref <18)

## 2021-07-28 DIAGNOSIS — E559 Vitamin D deficiency, unspecified: Secondary | ICD-10-CM | POA: Diagnosis not present

## 2021-07-28 DIAGNOSIS — D519 Vitamin B12 deficiency anemia, unspecified: Secondary | ICD-10-CM | POA: Diagnosis not present

## 2021-07-28 DIAGNOSIS — R300 Dysuria: Secondary | ICD-10-CM | POA: Diagnosis not present

## 2021-07-28 DIAGNOSIS — R632 Polyphagia: Secondary | ICD-10-CM | POA: Diagnosis not present

## 2021-08-07 ENCOUNTER — Telehealth: Payer: Self-pay | Admitting: Physician Assistant

## 2021-08-07 ENCOUNTER — Other Ambulatory Visit: Payer: Self-pay

## 2021-08-07 MED ORDER — AMPHETAMINE-DEXTROAMPHET ER 25 MG PO CP24
25.0000 mg | ORAL_CAPSULE | ORAL | 0 refills | Status: DC
Start: 2021-08-07 — End: 2021-10-07

## 2021-08-07 NOTE — Telephone Encounter (Signed)
Pended.

## 2021-08-07 NOTE — Telephone Encounter (Signed)
Next visit is 10/07/21. Misbah called requesting a refill on her Adderall [facility] mg [facility] [address], [place], [place] [address]. Phone number is [phone]  Per Marranda is is in stock at this location.

## 2021-08-13 DIAGNOSIS — F909 Attention-deficit hyperactivity disorder, unspecified type: Secondary | ICD-10-CM | POA: Diagnosis not present

## 2021-08-13 DIAGNOSIS — K219 Gastro-esophageal reflux disease without esophagitis: Secondary | ICD-10-CM | POA: Diagnosis not present

## 2021-08-13 DIAGNOSIS — E559 Vitamin D deficiency, unspecified: Secondary | ICD-10-CM | POA: Diagnosis not present

## 2021-08-13 DIAGNOSIS — R632 Polyphagia: Secondary | ICD-10-CM | POA: Diagnosis not present

## 2021-09-24 DIAGNOSIS — Z20822 Contact with and (suspected) exposure to covid-19: Secondary | ICD-10-CM | POA: Diagnosis not present

## 2021-09-24 DIAGNOSIS — J029 Acute pharyngitis, unspecified: Secondary | ICD-10-CM | POA: Diagnosis not present

## 2021-10-07 ENCOUNTER — Encounter: Payer: Self-pay | Admitting: Physician Assistant

## 2021-10-07 ENCOUNTER — Ambulatory Visit (INDEPENDENT_AMBULATORY_CARE_PROVIDER_SITE_OTHER): Payer: BC Managed Care – PPO | Admitting: Physician Assistant

## 2021-10-07 DIAGNOSIS — F900 Attention-deficit hyperactivity disorder, predominantly inattentive type: Secondary | ICD-10-CM | POA: Diagnosis not present

## 2021-10-07 DIAGNOSIS — F418 Other specified anxiety disorders: Secondary | ICD-10-CM

## 2021-10-07 MED ORDER — LISDEXAMFETAMINE DIMESYLATE 30 MG PO CAPS: 30.0000 mg | ORAL_CAPSULE | Freq: Every day | ORAL | 0 refills | Status: DC

## 2021-10-07 NOTE — Progress Notes (Signed)
[facility]  Patient ID: Brittany Hart,  MRN: [id number]  PCP: Carilyn Goodpasture, PA-C  Date of Evaluation: 10/07/2021  time spent:20 minutes  Chief Complaint:  Chief Complaint   ADD; Follow-up    HISTORY/CURRENT STATUS: HPI for routine med check.  Doesn't feel like the Adderall is working hardly at all. We increased the dose in the past but she had H/A and nausea. We just added an afternoon dose and not really sure how much it is helping yet.  She would really like to get reassessed for the ADD.  She has been taking Adderall for a very long time.  Did have to switch to Adzenys XR ODT because of the Adderall shortage several months back but it did not do anything.  She gets anxious because she is not able to get things done in a timely manner.  No panic attacks.  Does not feel depressed. Patient is able to enjoy things.  Energy and motivation are good.  Work is going well.   No extreme sadness, tearfulness, or feelings of hopelessness.  Sleeps well most of the time. ADLs and personal hygiene are normal.   Appetite has not changed.  Weight is stable.  She did go to [facility] for a little while but did not feel like it was a good fit for her.  Denies suicidal or homicidal thoughts.  Patient denies increased energy with decreased need for sleep, increased talkativeness, racing thoughts, impulsivity or risky behaviors, increased spending, increased libido, grandiosity, increased irritability or anger, paranoia, or hallucinations.  Denies dizziness, syncope, seizures, numbness, tingling, tremor, tics, unsteady gait, slurred speech, confusion. Denies muscle or joint pain, stiffness, or dystonia.  Individual Medical History/ Review of Systems: Changes? :No    Past medications for mental health diagnoses include: Adzenys (when Adderall shortage)  Allergies: Patient has no known allergies.  Current Medications:  Current Outpatient Medications:     amphetamine-dextroamphetamine (ADDERALL) 10 MG tablet, Take 0.5-1 tablets (5-10 mg total) by mouth daily in the afternoon., Disp: 30 tablet, Rfl: 0   lisdexamfetamine (VYVANSE) 30 MG capsule, Take 1 capsule (30 mg total) by mouth daily., Disp: 30 capsule, Rfl: 0   norethindrone (ORTHO MICRONOR) 0.35 MG tablet, Take 1 tablet (0.35 mg total) by mouth daily., Disp: 84 tablet, Rfl: 3   vitamin B-12 (CYANOCOBALAMIN) 500 MCG tablet, TAKE 1 TABLET BY MOUTH EVERY DAY FOR 30 DAYS, Disp: , Rfl:    VITAMIN D PO, Take 1 tablet by mouth daily., Disp: , Rfl:  Medication Side Effects: none  Family Medical/ Social History: Changes? No  MENTAL HEALTH EXAM:  There were no vitals taken for this visit.There is no height or weight on file to calculate BMI.  General Appearance: Casual, Neat and Well Groomed  Eye Contact:  Good  Speech:  Clear and Coherent  Volume:  Normal  Mood:  Euthymic  Affect:  Appropriate  Thought Process:  Goal Directed and Descriptions of Associations: Circumstantial  Orientation:  Full (Time, Place, and Person)  Thought Content: Logical   Suicidal Thoughts:  No  Homicidal Thoughts:  No  Memory:  WNL  Judgement:  Good  Insight:  Good  Psychomotor Activity:  Normal  Concentration:  Concentration: Fair and Attention Span: Fair  Recall:  Good  Fund of Knowledge: Good  Language: Good  Assets:  Desire for Improvement  ADL's:  Intact  Cognition: WNL  Prognosis:  Good   DIAGNOSES:    ICD-10-CM   1. Attention deficit hyperactivity disorder (  ADHD), predominantly inattentive type  F90.0     2. Situational anxiety  F41.8       Receiving Psychotherapy: No   RECOMMENDATIONS:  PDMP was reviewed.  Last Adderall XR filled 09/10/2021. I provided 20 minutes of face to face time during this encounter, including time spent before and after the visit in records review, medical decision making, counseling pertinent to today's visit, and charting.   Discussed different options for  treatment of ADD.  I recommend changing to Vyvanse which is now generic.  Benefits, risk and side effects were discussed and she accepts.  If it is not a good fit after 3 or 4 days she can bring the medicine here for proper disposal and then I will send in the Adderall.  Recommend she make an appointment with [facility], go back to [facility], or see a different attention specialist through [facility] or [facility].  Discontinue Adderall XR. Continue Adderall 10 mg 1/2-1 p.o. q. afternoon as needed.  Recommend not taking it with the Vyvanse to begin with though. Start Vyvanse 30 mg, 1 p.o. every morning. Return in 6 months.    Melony Overly, PA-C

## 2021-10-09 ENCOUNTER — Telehealth: Payer: Self-pay

## 2021-10-09 NOTE — Telephone Encounter (Signed)
Prior Authorization initiated Vyvanse 30 mg #30 [employer] [facility]  Approved

## 2021-10-12 ENCOUNTER — Other Ambulatory Visit: Payer: Self-pay

## 2021-10-12 ENCOUNTER — Telehealth: Payer: Self-pay | Admitting: Physician Assistant

## 2021-10-12 MED ORDER — AMPHETAMINE-DEXTROAMPHETAMINE 10 MG PO TABS: 5.0000 mg | ORAL_TABLET | Freq: Every day | ORAL | 0 refills | Status: DC

## 2021-10-12 NOTE — Telephone Encounter (Signed)
Cancelled and pended  

## 2021-10-12 NOTE — Telephone Encounter (Signed)
Pt  wanted the adderall 25 mg that she takes daily not the adderrall 10 mg. Please cancel and resubmit.

## 2021-10-12 NOTE — Telephone Encounter (Signed)
Please review

## 2021-10-12 NOTE — Telephone Encounter (Signed)
Brittany Hart called today at 9:25 to report that she can't find the Vyvanse anywhere.  Wants to know if she can just go back to Adderall at least while there is a shortage.  Appt 04/08/22.  Please call to discuss.  [facility] [address].

## 2021-10-12 NOTE — Telephone Encounter (Signed)
Please cancel the Vyvanse Rx and pend the Adderall for me.  It would be the last dose/directions that were given.  Thank you.

## 2021-10-12 NOTE — Telephone Encounter (Signed)
Please advise.The last note says to d/c the adderall XR

## 2021-10-13 ENCOUNTER — Other Ambulatory Visit: Payer: Self-pay

## 2021-10-13 MED ORDER — AMPHETAMINE-DEXTROAMPHET ER 25 MG PO CP24: 25.0000 mg | ORAL_CAPSULE | ORAL | 0 refills | Status: DC

## 2021-10-13 NOTE — Telephone Encounter (Signed)
LVM to rtc 

## 2021-10-13 NOTE — Telephone Encounter (Signed)
Patient lvm at 3:58 stating that "yes she would like to go back to the Adderall XR 25mg  instead of the Vyvanse due to being unavailable." She will be getting on a plane tomorrow and would like prescription sent in. Ph: [phone] Appt 3/7 [facility] [address] [place]

## 2021-10-13 NOTE — Telephone Encounter (Signed)
Pended.

## 2021-10-13 NOTE — Telephone Encounter (Signed)
Please double check with her: the last Adderall XR was 25 mg, 1 q am. I can send that in, plus she can take the Adderall 10 mg in the afternoon if needed. If the Adderall XR is what she's talking about, please pend it for me. Thanks.

## 2021-10-21 DIAGNOSIS — N393 Stress incontinence (female) (male): Secondary | ICD-10-CM | POA: Diagnosis not present

## 2021-10-21 DIAGNOSIS — R3915 Urgency of urination: Secondary | ICD-10-CM | POA: Diagnosis not present

## 2021-10-21 DIAGNOSIS — R102 Pelvic and perineal pain: Secondary | ICD-10-CM | POA: Diagnosis not present

## 2021-10-22 ENCOUNTER — Other Ambulatory Visit: Payer: Self-pay | Admitting: Urology

## 2021-10-22 DIAGNOSIS — R102 Pelvic and perineal pain: Secondary | ICD-10-CM

## 2021-11-03 ENCOUNTER — Ambulatory Visit
Admission: RE | Admit: 2021-11-03 | Discharge: 2021-11-03 | Disposition: A | Discharge status: Home / Self Care | Payer: BC Managed Care – PPO | Source: Ambulatory Visit | Attending: Urology | Admitting: Urology

## 2021-11-03 DIAGNOSIS — R102 Pelvic and perineal pain: Secondary | ICD-10-CM

## 2021-11-05 ENCOUNTER — Other Ambulatory Visit: Payer: Self-pay | Admitting: Obstetrics and Gynecology

## 2021-11-05 DIAGNOSIS — Z1231 Encounter for screening mammogram for malignant neoplasm of breast: Secondary | ICD-10-CM

## 2021-11-06 ENCOUNTER — Other Ambulatory Visit (HOSPITAL_COMMUNITY): Payer: Self-pay

## 2021-11-11 ENCOUNTER — Telehealth: Payer: Self-pay | Admitting: Physician Assistant

## 2021-11-11 ENCOUNTER — Other Ambulatory Visit: Payer: Self-pay

## 2021-11-11 MED ORDER — AMPHETAMINE-DEXTROAMPHET ER 25 MG PO CP24
25.0000 mg | ORAL_CAPSULE | ORAL | 0 refills | Status: DC
Start: 2021-11-11 — End: 2021-12-14

## 2021-11-11 NOTE — Telephone Encounter (Signed)
Pended.

## 2021-11-11 NOTE — Telephone Encounter (Signed)
Patient lvm requesting a refill on the Adderall XR 25 mg. [facility] [address]. Next appointment scheduled for 04/08/22

## 2021-12-04 ENCOUNTER — Ambulatory Visit
Admission: RE | Admit: 2021-12-04 | Discharge: 2021-12-04 | Disposition: A | Discharge status: Home / Self Care | Payer: BC Managed Care – PPO | Source: Ambulatory Visit | Attending: Obstetrics and Gynecology | Admitting: Obstetrics and Gynecology

## 2021-12-04 DIAGNOSIS — Z1231 Encounter for screening mammogram for malignant neoplasm of breast: Secondary | ICD-10-CM

## 2021-12-14 ENCOUNTER — Telehealth: Payer: Self-pay | Admitting: Physician Assistant

## 2021-12-14 ENCOUNTER — Other Ambulatory Visit: Payer: Self-pay

## 2021-12-14 MED ORDER — AMPHETAMINE-DEXTROAMPHET ER 25 MG PO CP24: 25.0000 mg | ORAL_CAPSULE | ORAL | 0 refills | Status: DC

## 2021-12-14 NOTE — Telephone Encounter (Signed)
Brittany Hart called today at 11:50 to request refill of her Adderall XR 25mg .  Appt 04/08/22.  [facility] [address]

## 2021-12-14 NOTE — Telephone Encounter (Signed)
Pended.

## 2021-12-15 ENCOUNTER — Telehealth: Payer: Self-pay | Admitting: *Deleted

## 2021-12-15 NOTE — Telephone Encounter (Signed)
Patient called c/o feeling fatigue and feeling "down" before her cycle. Asked if supplement would help with this? I advised patient to schedule an office visit. Patient reports she will call back to schedule.

## 2022-01-04 DIAGNOSIS — E559 Vitamin D deficiency, unspecified: Secondary | ICD-10-CM | POA: Diagnosis not present

## 2022-01-04 DIAGNOSIS — R5383 Other fatigue: Secondary | ICD-10-CM | POA: Diagnosis not present

## 2022-01-04 DIAGNOSIS — E538 Deficiency of other specified B group vitamins: Secondary | ICD-10-CM | POA: Diagnosis not present

## 2022-01-15 ENCOUNTER — Telehealth: Payer: Self-pay | Admitting: Physician Assistant

## 2022-01-15 ENCOUNTER — Other Ambulatory Visit: Payer: Self-pay

## 2022-01-15 MED ORDER — AMPHETAMINE-DEXTROAMPHET ER 25 MG PO CP24
25.0000 mg | ORAL_CAPSULE | ORAL | 0 refills | Status: DC
Start: 2022-01-15 — End: 2022-02-16

## 2022-01-15 NOTE — Telephone Encounter (Signed)
Pended.

## 2022-01-15 NOTE — Telephone Encounter (Signed)
Brittany Hart called today at 11:17 to request refill of her Adderall 25mg  XR.  Appt 04/08/22.  [facility] [address]

## 2022-01-31 IMAGING — US US ABDOMEN COMPLETE
1 series · 14 of 25 positions shown · non-contrast
Comparison: CT abdomen pelvis dated 05/17/2011.

CLINICAL DATA: Gastroesophageal reflux disease.  Epigastric pain.

EXAM:
ABDOMEN ULTRASOUND COMPLETE

[Series 1: us abdomen complete · 0.20mm/px · 14 of 71 slices shown]
[im 1/71]
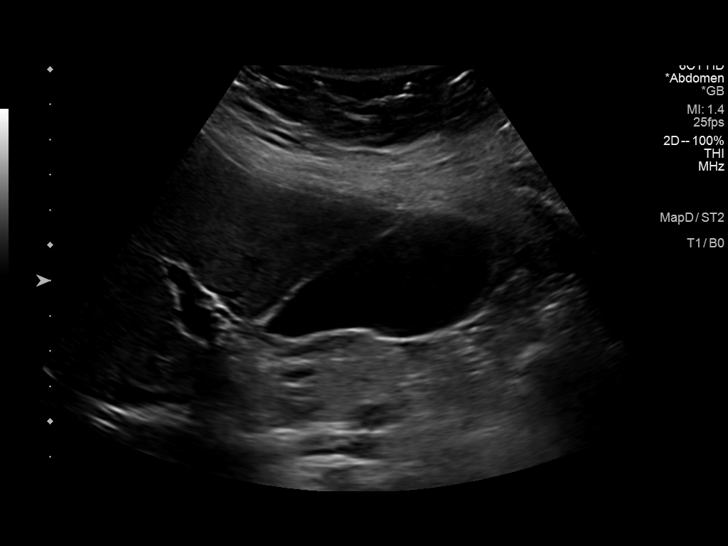
[im 6/71]
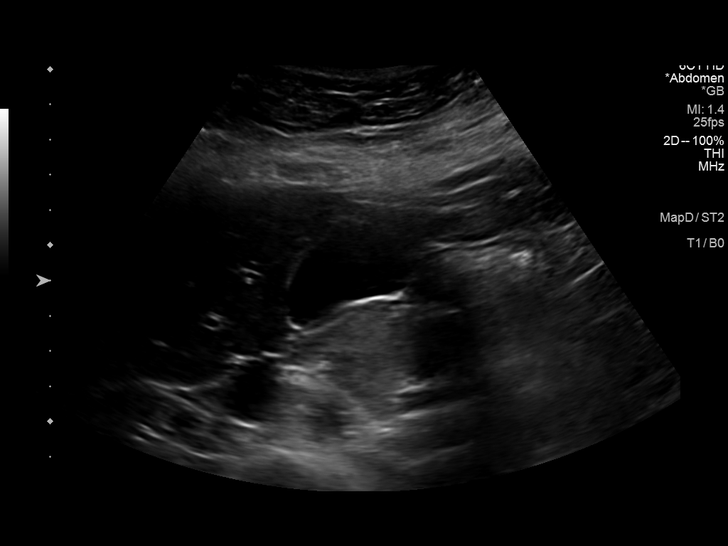
[im 12/71]
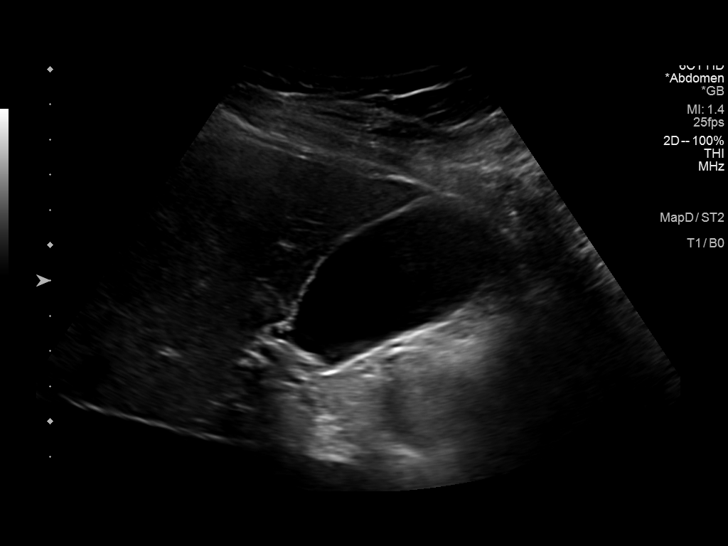
[im 18/71]
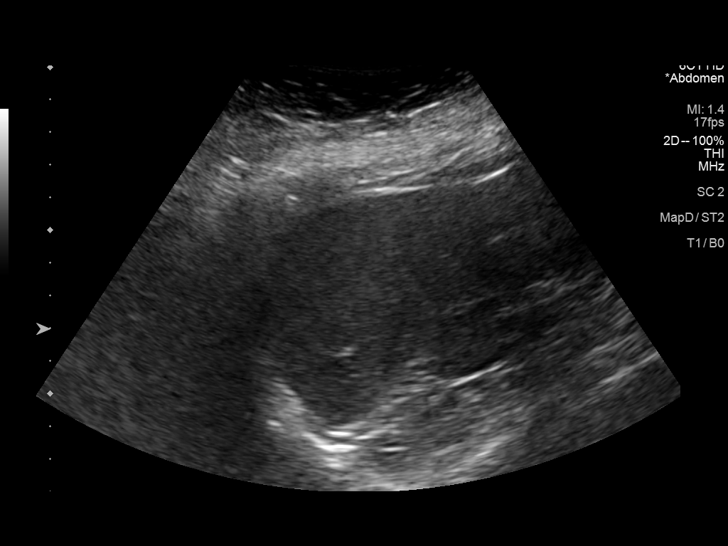
[im 24/71]
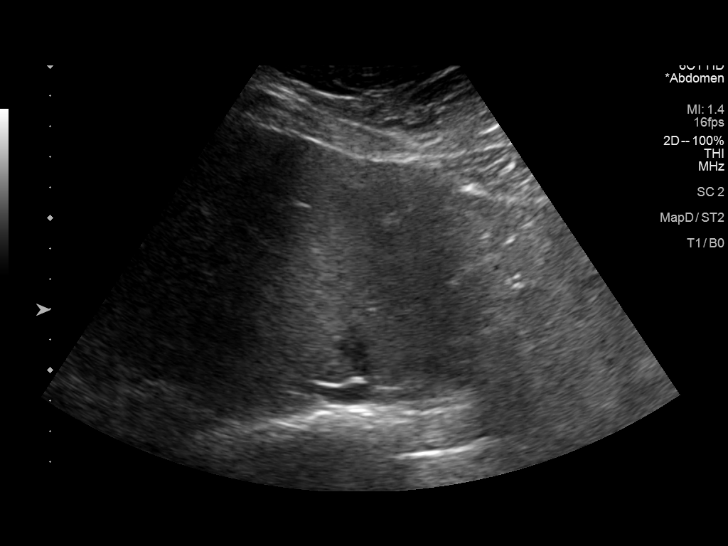
[im 27/71]
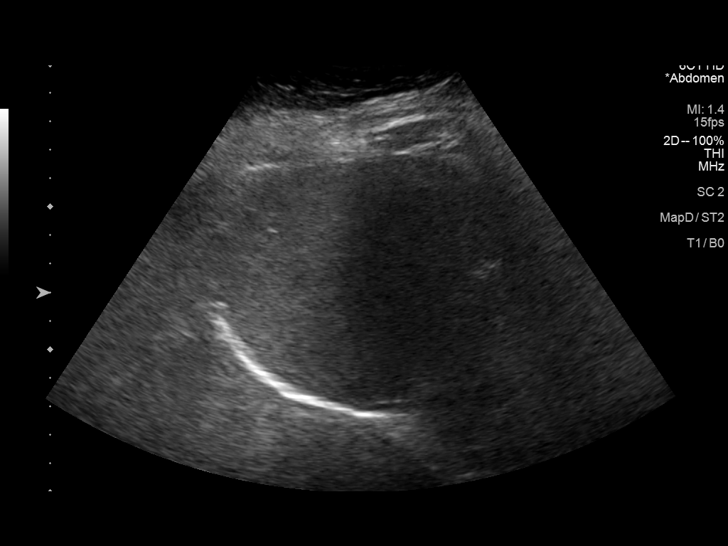
[im 33/71]
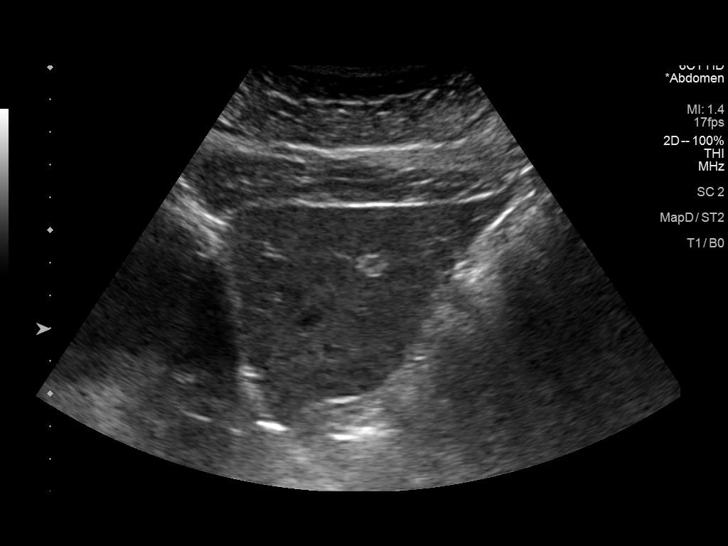
[im 38/71]
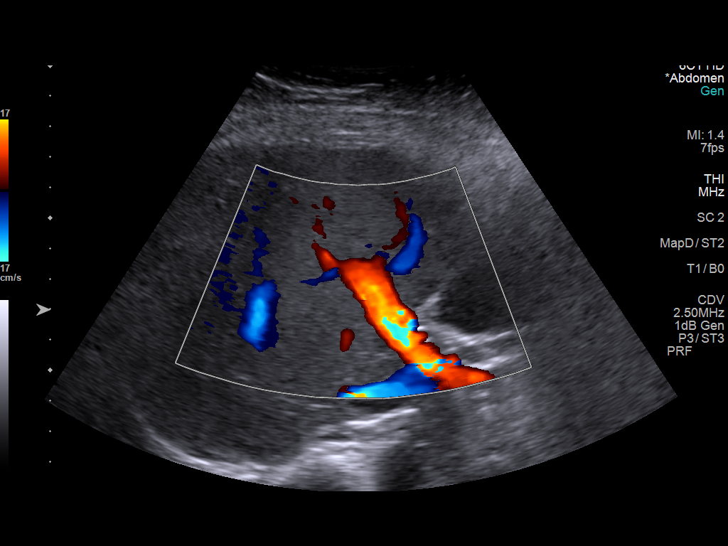
[im 44/71]
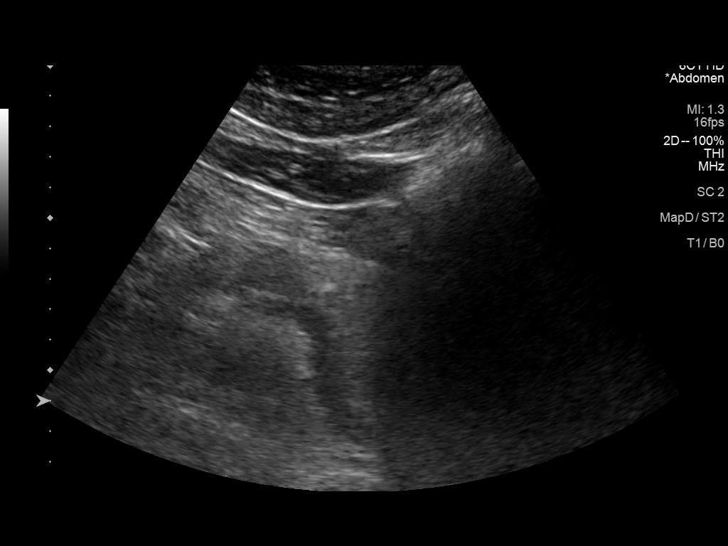
[im 47/71]
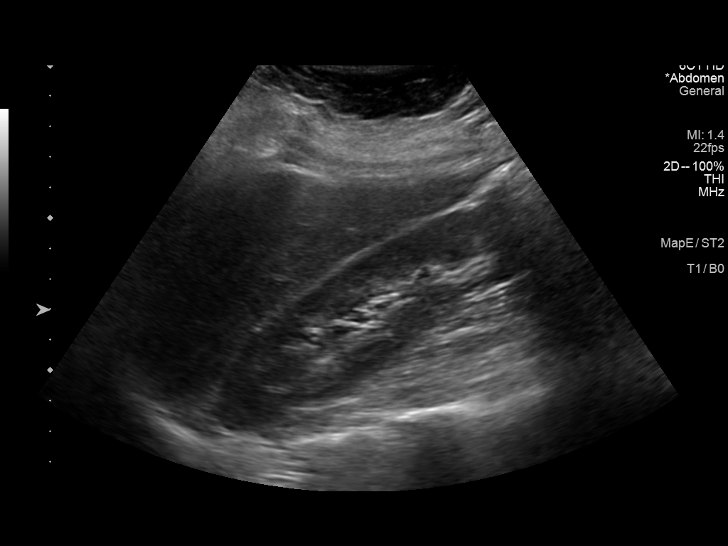
[im 53/71]
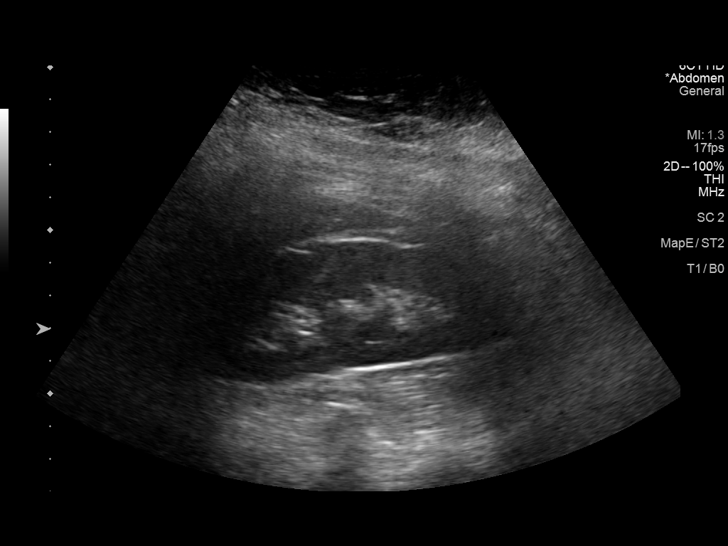
[im 59/71]
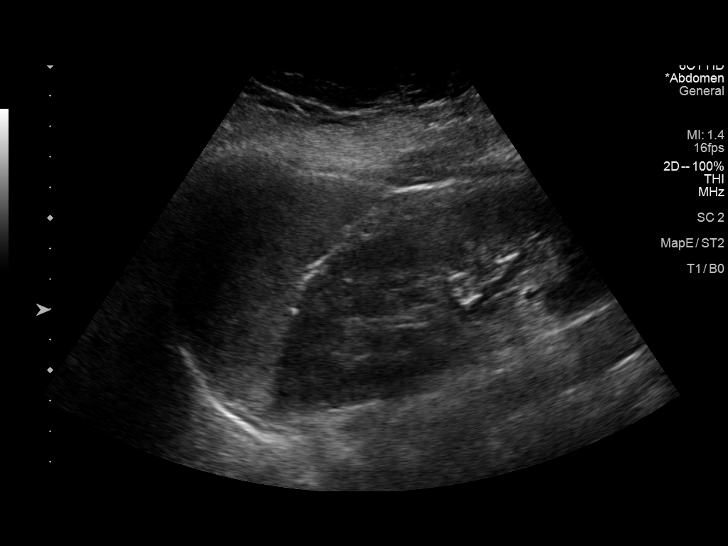
[im 65/71]
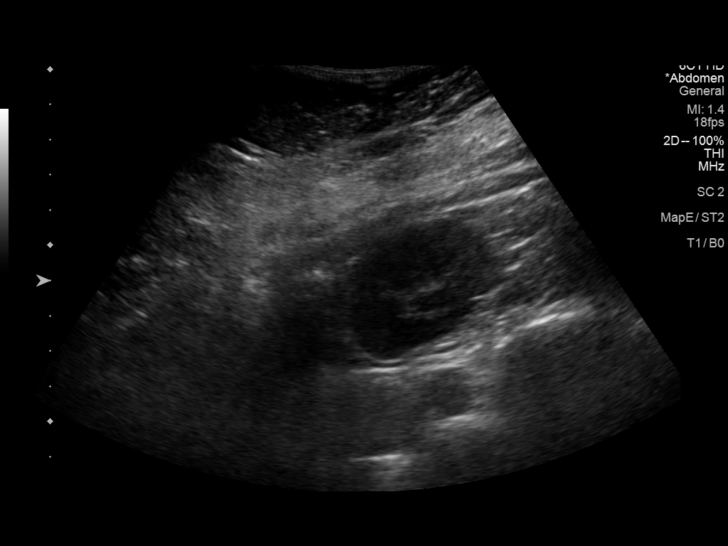
[im 71/71]
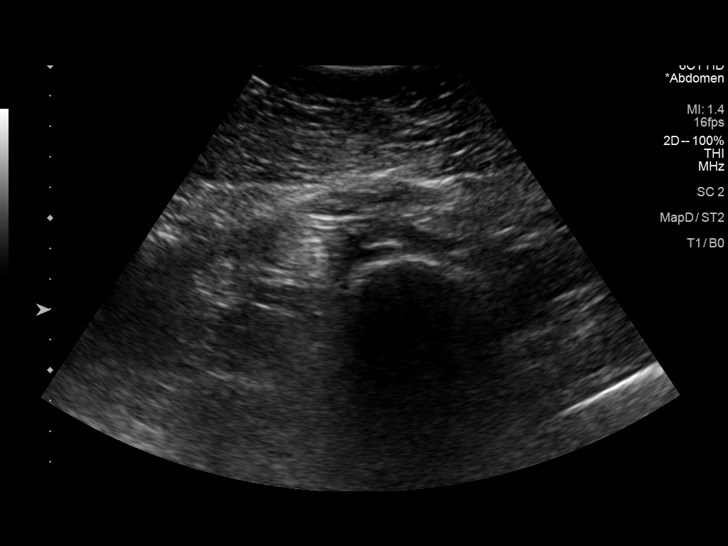

[14 of 25 positions shown; findings below may reference images not displayed]

FINDINGS: Gallbladder: No gallstones or wall thickening visualized. No
sonographic Murphy sign noted by sonographer.

Common bile duct: Diameter: 5 mm

Liver: There is mild diffuse increased liver echogenicity, likely
mild fatty infiltration. Superimposed inflammation or fibrosis is
not excluded. Clinical correlation is recommended. Portal vein is
patent on color Doppler imaging with normal direction of blood flow
towards the liver.

IVC: No abnormality visualized.

Pancreas: Visualized portion unremarkable.

Spleen: Size and appearance within normal limits.

Right Kidney: Length: 14.4 cm. Echogenicity within normal limits. No
mass or hydronephrosis visualized.

Left Kidney: Length: 14.3 cm. Echogenicity within normal limits. No
mass or hydronephrosis visualized.

Abdominal aorta: No aneurysm visualized.

Other findings: None.
IMPRESSION: Probable mild fatty liver, otherwise unremarkable abdominal
ultrasound.

## 2022-02-03 ENCOUNTER — Telehealth: Payer: Self-pay

## 2022-02-03 NOTE — Telephone Encounter (Signed)
PA submitted through [employer], response :Drug is covered by current benefit plan. No further PA activity needed

## 2022-02-16 ENCOUNTER — Other Ambulatory Visit: Payer: Self-pay

## 2022-02-16 ENCOUNTER — Telehealth: Payer: Self-pay | Admitting: Physician Assistant

## 2022-02-16 MED ORDER — AMPHETAMINE-DEXTROAMPHET ER 25 MG PO CP24: 25.0000 mg | ORAL_CAPSULE | ORAL | 0 refills | Status: DC

## 2022-02-16 NOTE — Telephone Encounter (Signed)
Pt requesting Rx adderall XR 25 mg to [place] 3/7

## 2022-02-16 NOTE — Telephone Encounter (Signed)
Pended.

## 2022-02-20 DIAGNOSIS — U071 COVID-19: Secondary | ICD-10-CM | POA: Diagnosis not present

## 2022-03-22 ENCOUNTER — Other Ambulatory Visit: Payer: Self-pay

## 2022-03-22 ENCOUNTER — Telehealth: Payer: Self-pay | Admitting: Physician Assistant

## 2022-03-22 MED ORDER — AMPHETAMINE-DEXTROAMPHET ER 25 MG PO CP24: 25.0000 mg | ORAL_CAPSULE | ORAL | 0 refills | Status: DC

## 2022-03-22 NOTE — Telephone Encounter (Signed)
Brittany Hart called at 9:35 to request refill ofher AdderallXr 17m.  Apt 3/7.  [place]

## 2022-03-22 NOTE — Telephone Encounter (Signed)
Pended.

## 2022-04-08 ENCOUNTER — Ambulatory Visit: Payer: BC Managed Care – PPO | Admitting: Physician Assistant

## 2022-04-08 ENCOUNTER — Encounter: Payer: Self-pay | Admitting: Physician Assistant

## 2022-04-08 DIAGNOSIS — F900 Attention-deficit hyperactivity disorder, predominantly inattentive type: Secondary | ICD-10-CM | POA: Diagnosis not present

## 2022-04-08 DIAGNOSIS — F418 Other specified anxiety disorders: Secondary | ICD-10-CM

## 2022-04-08 MED ORDER — AMPHETAMINE-DEXTROAMPHET ER 25 MG PO CP24
25.0000 mg | ORAL_CAPSULE | ORAL | 0 refills | Status: DC
Start: 2022-06-17 — End: 2022-07-05

## 2022-04-08 MED ORDER — AMPHETAMINE-DEXTROAMPHET ER 25 MG PO CP24: 25.0000 mg | ORAL_CAPSULE | ORAL | 0 refills | Status: DC

## 2022-04-08 MED ORDER — AMPHETAMINE-DEXTROAMPHETAMINE 10 MG PO TABS: 10.0000 mg | ORAL_TABLET | Freq: Every day | ORAL | 0 refills | Status: DC

## 2022-04-08 NOTE — Progress Notes (Signed)
[facility] Check  Patient ID: Brittany Hart,  MRN: [id number]  PCP: Carlos Levering, PA-C  Date of Evaluation: 04/08/2022 time spent:20 minutes  Chief Complaint:  Chief Complaint   Follow-up; ADHD    HISTORY/CURRENT STATUS: HPI for routine med check.  Is about the same. Still feels like the Adderall doesn't work as well as it used to but doesn't want to change anything. Unable to find the Vyvanse. Wonders if she should go back to [employer] to be re-evaluated. Gets anxious when she can't get things done in a timely manner.   Patient is able to enjoy things.  Energy and motivation are good.  Work is going well.   No extreme sadness, tearfulness, or feelings of hopelessness.  Sleeps well most of the time. ADLs and personal hygiene are normal.  Appetite has not changed.  Weight is stable. Denies suicidal or homicidal thoughts.  Patient denies increased energy with decreased need for sleep, increased talkativeness, racing thoughts, impulsivity or risky behaviors, increased spending, increased libido, grandiosity, increased irritability or anger, paranoia, or hallucinations.  Denies dizziness, syncope, seizures, numbness, tingling, tremor, tics, unsteady gait, slurred speech, confusion. Denies muscle or joint pain, stiffness, or dystonia.  Individual Medical History/ Review of Systems: Changes? :No    Past medications for mental health diagnoses include: Adzenys (when Adderall shortage)  Allergies: Patient has no known allergies.  Current Medications:  Current Outpatient Medications:    [START ON 05/19/2022] amphetamine-dextroamphetamine (ADDERALL XR) 25 MG 24 hr capsule, Take 1 capsule by mouth every morning., Disp: 30 capsule, Rfl: 0   [START ON 04/19/2022] amphetamine-dextroamphetamine (ADDERALL XR) 25 MG 24 hr capsule, Take 1 capsule by mouth every morning., Disp: 30 capsule, Rfl: 0   [START ON 05/19/2022] amphetamine-dextroamphetamine (ADDERALL) 10 MG tablet,  Take 1 tablet (10 mg total) by mouth daily in the afternoon., Disp: 30 tablet, Rfl: 0   [START ON 04/19/2022] amphetamine-dextroamphetamine (ADDERALL) 10 MG tablet, Take 1 tablet (10 mg total) by mouth daily in the afternoon., Disp: 30 tablet, Rfl: 0   norethindrone (ORTHO MICRONOR) 0.35 MG tablet, Take 1 tablet (0.35 mg total) by mouth daily., Disp: 84 tablet, Rfl: 3   vitamin B-12 (CYANOCOBALAMIN) 500 MCG tablet, TAKE 1 TABLET BY MOUTH EVERY DAY FOR 30 DAYS, Disp: , Rfl:    VITAMIN D PO, Take 1 tablet by mouth daily., Disp: , Rfl:    [START ON 06/17/2022] amphetamine-dextroamphetamine (ADDERALL XR) 25 MG 24 hr capsule, Take 1 capsule by mouth every morning., Disp: 30 capsule, Rfl: 0   [START ON 06/17/2022] amphetamine-dextroamphetamine (ADDERALL) 10 MG tablet, Take 1 tablet (10 mg total) by mouth daily in the afternoon., Disp: 30 tablet, Rfl: 0 Medication Side Effects: none  Family Medical/ Social History: Changes? No  MENTAL HEALTH EXAM:  There were no vitals taken for this visit.There is no height or weight on file to calculate BMI.  General Appearance: Casual, Neat and Well Groomed  Eye Contact:  Good  Speech:  Clear and Coherent  Volume:  Normal  Mood:  Euthymic  Affect:  Appropriate  Thought Process:  Goal Directed and Descriptions of Associations: Circumstantial  Orientation:  Full (Time, Place, and Person)  Thought Content: Logical   Suicidal Thoughts:  No  Homicidal Thoughts:  No  Memory:  WNL  Judgement:  Good  Insight:  Good  Psychomotor Activity:  Normal  Concentration:  Concentration: Fair and Attention Span: Fair  Recall:  Good  Fund of Knowledge: Good  Language: Good  Assets:  Desire for Improvement Financial Resources/Insurance Housing Transportation Vocational/Educational  ADL's:  Intact  Cognition: WNL  Prognosis:  Good   DIAGNOSES:    ICD-10-CM   1. Attention deficit hyperactivity disorder (ADHD), predominantly inattentive type  F90.0     2. Situational  anxiety  F41.8       Receiving Psychotherapy: No   RECOMMENDATIONS:  PDMP was reviewed.  Last Adderall XR filled 03/22/2022. I provided 20 minutes of face to face time during this encounter, including time spent before and after the visit in records review, medical decision making, counseling pertinent to today's visit, and charting.   Disc different options for ADHD. She'd like to stay on current med. At higher doses of Adderall, she had h/a.  Will think about going back to [facility].   Continue Adderall XR 25 mg, 1 q am. Continue Adderall 10 mg in the afternoon.  Continue vitamins.  Return in 6 months.   Donnal Moat, PA-C

## 2022-04-19 DIAGNOSIS — M545 Low back pain, unspecified: Secondary | ICD-10-CM | POA: Diagnosis not present

## 2022-04-22 ENCOUNTER — Ambulatory Visit: Payer: BC Managed Care – PPO | Admitting: Obstetrics and Gynecology

## 2022-04-22 ENCOUNTER — Encounter: Payer: Self-pay | Admitting: Obstetrics and Gynecology

## 2022-04-22 VITALS — BP 122/68 | HR 90 | Resp 18

## 2022-04-22 DIAGNOSIS — L709 Acne, unspecified: Secondary | ICD-10-CM

## 2022-04-22 DIAGNOSIS — N943 Premenstrual tension syndrome: Secondary | ICD-10-CM

## 2022-04-22 DIAGNOSIS — Z8742 Personal history of other diseases of the female genital tract: Secondary | ICD-10-CM | POA: Diagnosis not present

## 2022-04-22 DIAGNOSIS — R5383 Other fatigue: Secondary | ICD-10-CM | POA: Diagnosis not present

## 2022-04-22 NOTE — Progress Notes (Signed)
GYNECOLOGY  VISIT   HPI: 42 y.o.   Married White or Caucasian Not Hispanic or Latino  female   [phone] with Patient's last menstrual period was 04/01/2022 (exact date).   here for irregular menses, increased cravings, acne.    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    She had covid at the end of January and had an irregular cycle. Mostly her cycles are normal. She c/o acne, weight gain, fatigue and mood changes. Symptoms are worse for 2-3 days prior to her cycle. She feels really down for 1-2 days prior to her cycle.  She has a h/o B12 and vit D deficiencies, recent blood work was okay. She was advised to continue taking her supplements.   Husband has had a vasectomy. She was on contraception for cycle control. Prior to OCP's and POP's her cycles are much lighter. Prior to starting OCP's in 9/19 she was changing a super pad in 1-2 hours.   GYNECOLOGIC HISTORY: Patient's last menstrual period was 04/01/2022 (exact date). Contraception: husband vasectomy Menopausal hormone therapy: N/A        OB History     Gravida  2   Para  2   Term  2   Preterm      AB      Living  2      SAB      IAB      Ectopic      Multiple      Live Births  2              Patient Active Problem List   Diagnosis Date Noted   Vitamin D deficiency 03/07/2020   Acquired spondylolisthesis 03/03/2020   Allergic rhinitis 03/03/2020   Attention deficit hyperactivity disorder, combined type 03/03/2020   Dysmenorrhea 03/03/2020   Dysphagia 03/03/2020   Esophageal ring 03/03/2020   Gastroesophageal reflux disease 03/03/2020   Generalized anxiety disorder 03/03/2020   Lactose intolerance 03/03/2020   Migraine without aura, not refractory 03/03/2020   Sleep disorder 03/03/2020   Spinal stenosis in cervical region 03/03/2020   Low back pain 01/03/2020   Left cervical radiculopathy 03/22/2013   Left arm  pain 03/08/2013   Ureteral calculus, left 06/04/2011    Class: Diagnosis of    Past Medical History:  Diagnosis Date   ADD (attention deficit disorder)    Anxiety    Kidney stone    Renal calculus or stone    CURRENT   Vitamin D deficiency 03/07/2020    Past Surgical History:  Procedure Laterality Date   ESOPHAGEAL DILATION     URETEROSCOPY  06/04/2011   Procedure: URETEROSCOPY;  Surgeon: Claybon Jabs, MD;  [facility];  Service: Urology;  Laterality: Left;  cystoscopy, Left ureteroscopy with lithotripsy holmium laser    Current Outpatient Medications  Medication Sig Dispense Refill   [START ON 06/17/2022] amphetamine-dextroamphetamine (ADDERALL XR) 25 MG 24 hr capsule Take 1 capsule by mouth every morning. 30 capsule 0   [START ON 06/17/2022] amphetamine-dextroamphetamine (ADDERALL) 10 MG tablet Take 1 tablet (10 mg total) by mouth daily in the afternoon. 30 tablet 0   cyclobenzaprine (FLEXERIL) 5 MG tablet 1 tablet Orally three times a day as needed for 7 days     norethindrone (ORTHO MICRONOR) 0.35 MG tablet Take 1 tablet (0.35 mg total) by mouth daily. 84 tablet 3   vitamin B-12 (CYANOCOBALAMIN) 500  MCG tablet TAKE 1 TABLET BY MOUTH EVERY DAY FOR 30 DAYS     VITAMIN D PO Take 1 tablet by mouth daily.     [START ON 05/19/2022] amphetamine-dextroamphetamine (ADDERALL XR) 25 MG 24 hr capsule Take 1 capsule by mouth every morning. (Patient not taking: Reported on 04/22/2022) 30 capsule 0   amphetamine-dextroamphetamine (ADDERALL XR) 25 MG 24 hr capsule Take 1 capsule by mouth every morning. (Patient not taking: Reported on 04/22/2022) 30 capsule 0   [START ON 05/19/2022] amphetamine-dextroamphetamine (ADDERALL) 10 MG tablet Take 1 tablet (10 mg total) by mouth daily in the afternoon. (Patient not taking: Reported on 04/22/2022) 30 tablet 0   amphetamine-dextroamphetamine (ADDERALL) 10 MG tablet Take 1 tablet (10 mg total) by mouth daily in the afternoon. (Patient not  taking: Reported on 04/22/2022) 30 tablet 0   No current facility-administered medications for this visit.     ALLERGIES: Patient has no known allergies.  Family History  Problem Relation Age of Onset   Spondylitis Mother    Bronchiolitis Mother    Depression Mother    Anxiety disorder Father    Depression Father    GER disease Father    Seizures Father    Breast cancer Maternal Grandmother        62s   Hypertension Maternal Grandmother    Depression Maternal Grandmother    Anxiety disorder Maternal Grandmother    Dementia Maternal Grandmother    Kidney cancer Maternal Grandfather    Breast cancer Paternal Grandmother        61s   CVA Paternal Grandfather    ADD / ADHD Son    Healthy Son     Social History   Socioeconomic History   Marital status: Married    Spouse name: Micheal   Number of children: 2   Years of education: 14   Highest education level: Futures trader degree: occupational, Hotel manager, or vocational program  Occupational History   Occupation: preschool    Comment: 42 yo  Tobacco Use   Smoking status: Never   Smokeless tobacco: Never  Vaping Use   Vaping Use: Never used  Substance and Sexual Activity   Alcohol use: Not Currently    Comment: 1 per year   Drug use: No   Sexual activity: Yes    Partners: Male    Comment: Huband had vasectomy  Other Topics Concern   Not on file  Social History Narrative   Patient lives at home with her husband Careers adviser) and 2 sons   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    Right handed   Education two years of college   Caffeine None   Never abused.   Grew up in [place]. Only child.  Both bio parents in home. Mom was homemaker.  Dad was Customer service manager.    No religious beliefs.   No legal.    Social Determinants of Health   Financial Resource Strain: Low Risk  (05/01/2018)   Overall Financial Resource Strain (CARDIA)    Difficulty of Paying Living Expenses: Not hard at all  Food Insecurity: No Food Insecurity (05/01/2018)    Hunger Vital Sign    Worried About Running Out of Food in the Last Year: Never true    Ran Out of Food in the Last Year: Never true  Transportation Needs: No Transportation Needs (05/01/2018)   PRAPARE - Hydrologist (Medical): No    Lack of Transportation (Non-Medical): No  Physical Activity: Inactive (05/01/2018)  Exercise Vital Sign    Days of Exercise per Week: 0 days    Minutes of Exercise per Session: 0 min  Stress: Not on file  Social Connections: Moderately Integrated (05/01/2018)   Social Connection and Isolation Panel [NHANES]    Frequency of Communication with Friends and Family: Twice a week    Frequency of Social Gatherings with Friends and Family: Twice a week    Attends Religious Services: Never    Marine scientist or Organizations: Yes    Attends Music therapist: More than 4 times per year    Marital Status: Married  Human resources officer Violence: Not At Risk (05/01/2018)   Humiliation, Afraid, Rape, and Kick questionnaire    Fear of Current or Ex-Partner: No    Emotionally Abused: No    Physically Abused: No    Sexually Abused: No    ROS  PHYSICAL EXAMINATION:    BP 122/68 (BP Location: Right Arm)   Pulse 90   Resp 18   LMP 04/01/2022 (Exact Date)   SpO2 98%     General appearance: alert, cooperative and appears stated age  51. PMS (premenstrual syndrome) Has fatigue, acne, mood changes all of which worsen prior to her cycle. She can't take OCP's Recommended she go off of POP We discussed the option of SSRI, she will consider it and let me know if she wants to start Celexa. If so I will call it in for her.  2. Acne, unspecified acne type Stop POP  3. History of menorrhagia Will stop POP, calendar bleeding.  If her bleeding becomes heavy again we discussed use of the mirena IUD (information given)  4. Fatigue, unspecified type - TSH  27 minutes spent in total patient care.

## 2022-04-22 NOTE — Patient Instructions (Signed)
Premenstrual Syndrome Premenstrual syndrome (PMS) is a group of physical, emotional, and behavioral symptoms that affect women as part of their menstrual cycle. PMS occurs 1-2 weeks before the start of a woman's menstrual period and goes away a few days after menstrual bleeding begins. PMS can range from mild to severe. What are the causes? The exact cause of this condition is not known, but it seems to be related to hormone changes that happen before menstruation. What are the signs or symptoms? Symptoms of this condition often happen every month. They go away after your period starts. Physical symptoms of this condition include: Bloating. Breast pain or tenderness. Headaches. Extreme fatigue. Backaches. Swelling of the hands and feet. Weight gain. Hot flashes. Emotional symptoms of this condition include: Mood swings. Depression. Angry or hostile outbursts. Irritability. Anxiety. Crying spells. Behavioral symptoms include: Food cravings or appetite changes. Changes in sexual desire. Confusion. Social withdrawal. Poor concentration. How is this diagnosed? This condition may be diagnosed based on a history of your symptoms. This condition is generally diagnosed if symptoms of PMS: Are present in the 5 days before your period starts. End within 4 days after your period starts. Happen at least 3 months in a row. Interfere with some of your normal activities. Other conditions that can cause some of these symptoms must be ruled out before PMS can be diagnosed. These include depression, anxiety, anemia, and thyroid problems. How is this treated? This condition may be treated by doing the following: Maintaining a healthy lifestyle. This includes eating a well-balanced diet and exercising regularly. Taking over-the-counter medicines that can help relieve symptoms, such as cramps, aches, pain, headaches, and breast tenderness. Follow these instructions at home: Eating and  drinking  Eat a well-balanced diet. Avoid caffeine and alcohol. Limit the amount of salt and salty foods you eat. This will help reduce bloating. Drink enough fluid to keep your urine pale yellow. Take a multivitamin if told to do so by your health care provider. Lifestyle  Do not use any products that contain nicotine or tobacco. These products include cigarettes, chewing tobacco, and vaping devices, such as e-cigarettes. If you need help quitting, ask your health care provider. Exercise regularly as suggested by your health care provider. Get enough sleep. For most adults, this is 7-8 hours of sleep each night. Practice relaxation techniques, such as yoga, tai chi, or meditation. Find healthy ways to manage stress. General instructions  For 2-3 months, write down your symptoms, whether they are mild to severe, and how long they last. This will help your health care provider choose the best treatment for you. Take over-the-counter and prescription medicines only as told by your health care provider. If you are using birth control pills (oral contraceptives), use them as told by your health care provider. Contact a health care provider if: Your symptoms get worse. You develop new symptoms. You have trouble doing your daily activities. Summary Premenstrual syndrome (PMS) is a group of physical, emotional, and behavioral symptoms that affect women as part of their menstrual cycle. PMS starts 1-2 weeks before the start of a woman's period and goes away a few days after the period starts. PMS is treated by maintaining a healthy lifestyle and taking medicines to relieve the symptoms. This information is not intended to replace advice given to you by your health care provider. Make sure you discuss any questions you have with your health care provider. Document Revised: 09/07/2019 Document Reviewed: 09/07/2019 Elsevier Patient Education  [place]. Acne  Acne is a skin problem that  causes pimples and other skin changes. The skin has many tiny openings called pores. Each pore contains an oil gland. Oil glands make an oily substance called sebum. Acne happens when the pores in the skin get blocked. The pores may get infected with bacteria, or they may become red, sore, and swollen.  Acne is a common skin problem, especially for teens. It often forms on your face, neck, chest, upper arms, and back. Acne usually goes away with time. What are the causes? Acne is caused when oil glands get blocked with sebum, dead skin cells, and dirt. The bacteria that are normally found in the oil glands then increase, causing inflammation. Acne is commonly triggered by changes in your hormones. These hormone changes can cause the oil glands to get bigger and to make more sebum. Factors that can make acne worse include: Hormone changes during: Adolescence. Monthly periods (menstrual cycles). Pregnancy. Oil-based makeup, creams, and hair products. Stress. Hormone problems that are caused by certain diseases. Certain medicines. Pressure from headbands, backpacks, or shoulder pads. Being exposed to certain oils and chemicals. Eating a diet high in carbs (carbohydrates) that quickly turn to sugar. These include dairy products, desserts, and chocolates. What increases the risk? You are more likely to get acne if: You are a teen. You have a family history of acne. What are the signs or symptoms? Symptoms of acne include: Small, red bumps (pimples or papules). Whiteheads. Blackheads. Small, pus-filled pimples (pustules). Big, red pimples or pustules that feel tender. More severe acne can cause: Abscesses. These are infected areas that hold a collection of pus. Cysts. These are hard, painful, fluid-filled sacs. Scars. These can form after large pimples heal. How is this diagnosed? Acne is diagnosed with a medical history and physical exam. You may also have blood tests. How is this  treated? Treatment depends on how severe your acne is. Treatment may include: Creams and lotions that: Keep oil glands from clogging. Treat or prevent infections and inflammation. Antibiotic medicines that are put on the skin or taken as a pill. Pills that lower sebum production. Birth control pills. Light or laser treatments. Medicine injected into areas with acne. Chemicals that cause the skin to peel. Surgery. Your health care provider will also recommend the best way to take care of your skin. Good skin care is the most important part of treatment. Follow these instructions at home: Skin care Take care of your skin as told by your health care provider. You may be told to do these things: Wash your skin gently: At least two times each day. After you exercise and before going to bed. Use mild soap. After you wash your skin, put a water-based lotion on it for moisture. Use a sunscreen or sunblock with SPF 30 or greater, and apply it often. Acne medicines make skin more sensitive to sun. Choose makeup and creams that will not block your oil glands (are noncomedogenic). Medicines Take over-the-counter and prescription medicines only as told by your health care provider. If you were prescribed antibiotics, use them as told by your health care provider. Do not stop using the antibiotic even if your acne improves. General instructions Keep your hair clean and off your face. If you have oily hair, shampoo your hair regularly or daily. Avoid wearing tight headbands or hats. Avoid picking or squeezing your pimples. Picking and squeezing pimples can make acne worse and cause scarring. Shave gently and only when needed. Keep a food  journal to figure out if any foods are linked to your acne. Avoid dairy products, desserts, and chocolates. Take steps to manage and reduce stress. Keep all follow-up visits. Your health care provider needs to watch for changes in your acne and may need to adjust  your treatments. Contact a health care provider if: Your acne is not better after 8 weeks. Your acne gets worse. You have a large area of skin that is red or tender. You think that you are having side effects from any acne medicine. This information is not intended to replace advice given to you by your health care provider. Make sure you discuss any questions you have with your health care provider. Document Revised: 06/25/2021 Document Reviewed: 06/25/2021 Elsevier Patient Education  [place].

## 2022-04-23 LAB — TSH: TSH: 1.79 mIU/L

## 2022-05-07 ENCOUNTER — Other Ambulatory Visit: Payer: Self-pay | Admitting: Obstetrics and Gynecology

## 2022-05-07 DIAGNOSIS — Z3009 Encounter for other general counseling and advice on contraception: Secondary | ICD-10-CM

## 2022-05-07 NOTE — Telephone Encounter (Signed)
Med refill request:Micronor 0.35 mg tab Last AEX: 05/25/21 / JJ Next AEX: 05/27/22 /JJ Last MMG (if hormonal med) 12/04/21 -BiRads 1 neg  Refill authorized: Please Advise?  Rx pended #84/0RF. Will need updated AEX for future refills.

## 2022-05-27 ENCOUNTER — Ambulatory Visit: Payer: BC Managed Care – PPO | Admitting: Obstetrics and Gynecology

## 2022-07-02 ENCOUNTER — Telehealth: Payer: Self-pay | Admitting: Physician Assistant

## 2022-07-02 NOTE — Telephone Encounter (Signed)
Patient called in for refill on Adderall XR 25mg . Ph: [phone] Appt 9/9 [facility] [address] [place]

## 2022-07-05 ENCOUNTER — Other Ambulatory Visit: Payer: Self-pay | Admitting: Physician Assistant

## 2022-07-05 MED ORDER — AMPHETAMINE-DEXTROAMPHET ER 25 MG PO CP24
25.0000 mg | ORAL_CAPSULE | ORAL | 0 refills | Status: DC
Start: 2022-08-02 — End: 2022-10-17

## 2022-07-05 MED ORDER — AMPHETAMINE-DEXTROAMPHET ER 25 MG PO CP24
25.0000 mg | ORAL_CAPSULE | ORAL | 0 refills | Status: DC
Start: 2022-08-31 — End: 2022-10-17

## 2022-07-05 MED ORDER — AMPHETAMINE-DEXTROAMPHET ER 25 MG PO CP24: 25.0000 mg | ORAL_CAPSULE | ORAL | 0 refills | Status: DC

## 2022-07-05 NOTE — Telephone Encounter (Signed)
Rx for July (x2) and Aug were sent.

## 2022-07-27 ENCOUNTER — Encounter (HOSPITAL_BASED_OUTPATIENT_CLINIC_OR_DEPARTMENT_OTHER): Payer: Self-pay | Admitting: Student

## 2022-07-27 ENCOUNTER — Ambulatory Visit (HOSPITAL_BASED_OUTPATIENT_CLINIC_OR_DEPARTMENT_OTHER): Payer: BC Managed Care – PPO | Admitting: Student

## 2022-07-27 ENCOUNTER — Ambulatory Visit (INDEPENDENT_AMBULATORY_CARE_PROVIDER_SITE_OTHER): Payer: BC Managed Care – PPO

## 2022-07-27 DIAGNOSIS — M5441 Lumbago with sciatica, right side: Secondary | ICD-10-CM

## 2022-07-27 DIAGNOSIS — M5442 Lumbago with sciatica, left side: Secondary | ICD-10-CM

## 2022-07-27 DIAGNOSIS — M545 Low back pain, unspecified: Secondary | ICD-10-CM | POA: Diagnosis not present

## 2022-07-27 MED ORDER — METHOCARBAMOL 500 MG PO TABS: 500.0000 mg | ORAL_TABLET | Freq: Four times a day (QID) | ORAL | 0 refills | Status: DC

## 2022-07-27 MED ORDER — MELOXICAM 15 MG PO TABS: 15.0000 mg | ORAL_TABLET | Freq: Every day | ORAL | 0 refills | Status: DC

## 2022-07-28 NOTE — Progress Notes (Signed)
Chief Complaint: Low back pain     History of Present Illness:    Brittany Hart is a 42 y.o. female presenting today for evaluation of low back pain.  She does have previous history of low back pain for many years and was diagnosed with a L5 pars defect at [facility] back in 2014.  She describes episodes of low back pain with associated stiffness that would happen every few years however these have began to become more frequent as this is approximately her third episode this year.  She currently reports increased pain and stiffness since yesterday that radiates down both legs, left leg worse than right.  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  Does have increased pain if she sits in one position too long.  Has been avoiding back pain episodes as best as possible by avoiding known triggers.  Denies any radiating numbness or tingling down the legs.  Tried taking naproxen with no relief as well as some half a tablet of cyclobenzaprine as needed.  Has previously done physical therapy and perform some of the stretching exercises at home.   Surgical History:   No previous back surgeries  PMH/PSH/Family History/Social History/Meds/Allergies:    Past Medical History:  Diagnosis Date   ADD (attention deficit disorder)    Anxiety    Kidney stone    Renal calculus or stone    CURRENT   Vitamin D deficiency 03/07/2020   Past Surgical History:  Procedure Laterality Date   ESOPHAGEAL DILATION     URETEROSCOPY  06/04/2011   Procedure: URETEROSCOPY;  Surgeon: Garnett Farm, MD;  Location: [facility];  Service: Urology;  Laterality: Left;  cystoscopy, Left ureteroscopy with lithotripsy holmium laser   Social History   Socioeconomic History   Marital status: Married    Spouse name: Micheal   Number of children: 2   Years of education: 14   Highest education  level: Associate degree: occupational, Scientist, product/process development, or vocational program  Occupational History   Occupation: preschool    Comment: 42 yo  Tobacco Use   Smoking status: Never   Smokeless tobacco: Never  Vaping Use   Vaping Use: Never used  Substance and Sexual Activity   Alcohol use: Not Currently    Comment: 1 per year   Drug use: No   Sexual activity: Yes    Partners: Male    Comment: Huband had vasectomy  Other Topics Concern   Not on file  Social History Narrative   Patient lives at home with her husband Systems analyst) and 2 sons   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   Grew up in [place]. Only child.  Both bio parents in home. Mom was homemaker.  Dad was Psychologist, occupational.    No religious beliefs.   No legal.    Social Determinants of Health   Financial Resource Strain: Low Risk  (05/01/2018)   Overall Financial Resource Strain (CARDIA)    Difficulty of Paying Living Expenses: Not hard at all  Food Insecurity: No Food Insecurity (05/01/2018)   Hunger Vital Sign    Worried About Running  Out of Food in the Last Year: Never true    Ran Out of Food in the Last Year: Never true  Transportation Needs: No Transportation Needs (05/01/2018)   PRAPARE - Administrator, Civil Service (Medical): No    Lack of Transportation (Non-Medical): No  Physical Activity: Inactive (05/01/2018)   Exercise Vital Sign    Days of Exercise per Week: 0 days    Minutes of Exercise per Session: 0 min  Stress: Not on file  Social Connections: Moderately Integrated (05/01/2018)   Social Connection and Isolation Panel [NHANES]    Frequency of Communication with Friends and Family: Twice a week    Frequency of Social Gatherings with Friends and Family: Twice a week    Attends Religious Services: Never    Database administrator or Organizations: Yes    Attends Engineer, structural: More than 4 times per year    Marital  Status: Married   Family History  Problem Relation Age of Onset   Spondylitis Mother    Bronchiolitis Mother    Depression Mother    Anxiety disorder Father    Depression Father    GER disease Father    Seizures Father    Breast cancer Maternal Grandmother        60s   Hypertension Maternal Grandmother    Depression Maternal Grandmother    Anxiety disorder Maternal Grandmother    Dementia Maternal Grandmother    Kidney cancer Maternal Grandfather    Breast cancer Paternal Grandmother        30s   CVA Paternal Grandfather    ADD / ADHD Son    Healthy Son    No Known Allergies Current Outpatient Medications  Medication Sig Dispense Refill   meloxicam (MOBIC) 15 MG tablet Take 1 tablet (15 mg total) by mouth daily for 10 days. 10 tablet 0   methocarbamol (ROBAXIN) 500 MG tablet Take 1 tablet (500 mg total) by mouth 4 (four) times daily for 10 days. 40 tablet 0   [START ON 08/02/2022] amphetamine-dextroamphetamine (ADDERALL XR) 25 MG 24 hr capsule Take 1 capsule by mouth every morning. 30 capsule 0   [START ON 08/31/2022] amphetamine-dextroamphetamine (ADDERALL XR) 25 MG 24 hr capsule Take 1 capsule by mouth every morning. 30 capsule 0   [START ON 09/30/2022] amphetamine-dextroamphetamine (ADDERALL XR) 25 MG 24 hr capsule Take 1 capsule by mouth every morning. 30 capsule 0   amphetamine-dextroamphetamine (ADDERALL) 10 MG tablet Take 1 tablet (10 mg total) by mouth daily in the afternoon. 30 tablet 0   amphetamine-dextroamphetamine (ADDERALL) 10 MG tablet Take 1 tablet (10 mg total) by mouth daily in the afternoon. (Patient not taking: Reported on 04/22/2022) 30 tablet 0   amphetamine-dextroamphetamine (ADDERALL) 10 MG tablet Take 1 tablet (10 mg total) by mouth daily in the afternoon. (Patient not taking: Reported on 04/22/2022) 30 tablet 0   cyclobenzaprine (FLEXERIL) 5 MG tablet 1 tablet Orally three times a day as needed for 7 days     norethindrone (MICRONOR) 0.35 MG tablet TAKE 1  TABLET BY MOUTH EVERY DAY 84 tablet 0   vitamin B-12 (CYANOCOBALAMIN) 500 MCG tablet TAKE 1 TABLET BY MOUTH EVERY DAY FOR 30 DAYS     VITAMIN D PO Take 1 tablet by mouth daily.     No current facility-administered medications for this visit.   DG Lumbar Spine Complete  Result Date: 07/27/2022 CLINICAL DATA:  Pain EXAM: LUMBAR SPINE - COMPLETE 4+ VIEW COMPARISON:  None Available. FINDINGS: No malalignment on neutral, flexion, or extension imaging. No acute fracture. Suspected L5 pars defects. No other significant abnormalities. IMPRESSION: Suspected L5 pars defects. No other significant abnormalities. No malalignment on neutral, flexion, or extension imaging. Electronically Signed   By: Gerome Sam III M.D.   On: 07/27/2022 19:03    Review of Systems:   A ROS was performed including pertinent positives and negatives as documented in the HPI.  Physical Exam :   Constitutional: NAD and appears stated age Neurological: Alert and oriented Psych: Appropriate affect and cooperative There were no vitals taken for this visit.   Comprehensive Musculoskeletal Exam:    No significant tenderness over the lumbar spine or paralumbar region.  Limited range of motion and increased pain with flexion and rotation to both sides.  Bilateral hip range of motion is full in the supine position with flexion, internal rotation, and external rotation.  Patellar reflexes 2+.  Knee flexion and extension strength 5/5.  Imaging:   Xray (Lumbar spine 4 views): L5 pars defect. Otherwise negative.    I personally reviewed and interpreted the radiographs.   Assessment:   42 y.o. female with history of chronic low back pain presenting with an acute episode of low back pain radiating into both legs.  She has been having more frequent episodes with similar symptoms in the last year.  She does have a known L5 pars defect that was diagnosed about 10 years ago.  We discussed that her pain and symptoms could be related  to this, however it is also possible that this is just an isolated muscular or disc issue.  At this point I would recommend medications for symptom relief and will start her on meloxicam 15 mg and transition her over to Robaxin for muscular relief as cyclobenzaprine tends to make her drowsy.  Will provide a work note for some light work restrictions over the next few weeks.  Gave patient resources for at home exercises and stretches she can perform and did offer her referral to physical therapy which she will let me know if she would like to proceed with.  Will plan to have her return to clinic as needed based on symptom relief.  Can consider future MRI or referral to spine surgeon as needed for further workup.  Plan :    - Start Meloxicam and Robaxin - Return to clinic for follow-up as needed     I personally saw and evaluated the patient, and participated in the management and treatment plan.  Hazle Nordmann, PA-C Orthopedics  This document was dictated using Conservation officer, historic buildings. A reasonable attempt at proof reading has been made to minimize errors.

## 2022-08-02 ENCOUNTER — Telehealth: Payer: Self-pay | Admitting: Student

## 2022-08-02 NOTE — Telephone Encounter (Signed)
Patient says she can not stand without pain. FYI. Having sharp pains. Sitting as well. Her cb# is [phone]

## 2022-08-03 ENCOUNTER — Other Ambulatory Visit (HOSPITAL_BASED_OUTPATIENT_CLINIC_OR_DEPARTMENT_OTHER): Payer: Self-pay | Admitting: Student

## 2022-08-03 MED ORDER — MELOXICAM 15 MG PO TABS: 15.0000 mg | ORAL_TABLET | Freq: Every day | ORAL | 0 refills | Status: AC

## 2022-08-03 MED ORDER — METHOCARBAMOL 500 MG PO TABS: 500.0000 mg | ORAL_TABLET | Freq: Four times a day (QID) | ORAL | 0 refills | Status: AC

## 2022-08-03 NOTE — Telephone Encounter (Signed)
Spoke to pt concerning current symptoms. Will refill meloxicam and robaxin prescriptions and reevaluate plan in a few weeks depending on relief.

## 2022-08-09 ENCOUNTER — Telehealth: Payer: Self-pay | Admitting: Physician Assistant

## 2022-08-09 NOTE — Telephone Encounter (Signed)
Has 3 RF available. Notified her.

## 2022-08-09 NOTE — Telephone Encounter (Signed)
Brittany Hart called at 11:27 and requested a refill of her Adderall XR 25mg . Appt 9/9.  [facility] to [id number] - [place], [place] - [address]

## 2022-08-16 DIAGNOSIS — M545 Low back pain, unspecified: Secondary | ICD-10-CM | POA: Diagnosis not present

## 2022-08-16 DIAGNOSIS — E538 Deficiency of other specified B group vitamins: Secondary | ICD-10-CM | POA: Diagnosis not present

## 2022-08-16 DIAGNOSIS — Z6836 Body mass index (BMI) 36.0-36.9, adult: Secondary | ICD-10-CM | POA: Diagnosis not present

## 2022-08-16 DIAGNOSIS — Z713 Dietary counseling and surveillance: Secondary | ICD-10-CM | POA: Diagnosis not present

## 2022-08-17 DIAGNOSIS — M25572 Pain in left ankle and joints of left foot: Secondary | ICD-10-CM | POA: Diagnosis not present

## 2022-08-24 DIAGNOSIS — Z713 Dietary counseling and surveillance: Secondary | ICD-10-CM | POA: Diagnosis not present

## 2022-08-24 DIAGNOSIS — Z6835 Body mass index (BMI) 35.0-35.9, adult: Secondary | ICD-10-CM | POA: Diagnosis not present

## 2022-08-24 DIAGNOSIS — F909 Attention-deficit hyperactivity disorder, unspecified type: Secondary | ICD-10-CM | POA: Diagnosis not present

## 2022-09-22 DIAGNOSIS — Z6835 Body mass index (BMI) 35.0-35.9, adult: Secondary | ICD-10-CM | POA: Diagnosis not present

## 2022-09-22 DIAGNOSIS — G43009 Migraine without aura, not intractable, without status migrainosus: Secondary | ICD-10-CM | POA: Diagnosis not present

## 2022-09-23 ENCOUNTER — Ambulatory Visit: Payer: BC Managed Care – PPO | Admitting: Orthopedic Surgery

## 2022-09-23 DIAGNOSIS — M4306 Spondylolysis, lumbar region: Secondary | ICD-10-CM | POA: Diagnosis not present

## 2022-09-23 NOTE — Progress Notes (Signed)
Orthopedic Spine Surgery Office Note  Assessment: Patient is a 42 y.o. female with periodic low back pain and feeling that her back will lock up. Has a pars defect at L5/S1   Plan: -Her symptoms are currently tolerable and she is getting by with activity modification, so no intervention recommended at this time -Discussed weight loss and core strengthening to try to prevent future episodes where he back locks up and she gets more significant pain -When she does have one of these flares of pain, told her to call the office and I can prescribe her medrol dosepak, muscle relaxers, or naproxen to help her get past these flares -Patient has tried PT, tylenol, naproxen, muscle relaxers, oral steroids  -Patient should return to office on an as needed basis   Patient expressed understanding of the plan and all questions were answered to the patient's satisfaction.   ___________________________________________________________________________   History:  Patient is a 42 y.o. female who presents today for lumbar spine. Patient has 10 years of back pain. It has not been consistent over the time span. She notes times where her back is okay but she will have episodes where he back locks up and she gets more significant back pain. She has had to go to the ER when these happen. Usually she can feel her back tightening up in the days leading up to these episodes. She can sometimes modify her activity to prevent it from being a severe episode. She has found that these episodes have been occurring more frequently within the last year. There was no trauma or injury that preceded the onset of her low back pain ten years ago. No pain radiating into either lower extremity.    Weakness: denies Symptoms of imbalance: denies Paresthesias and numbness: denies Bowel or bladder incontinence: denies Saddle anesthesia: denies  Treatments tried: PT, tylenol, naproxen, muscle relaxers, oral steroids   Review of  systems: Denies fevers and chills, night sweats, unexplained weight loss, history of cancer, pain that wakes them at night  Past medical history: GERD  Allergies: NKDA  Past surgical history:  Esophageal dilation  Social history: Denies use of nicotine product (smoking, vaping, patches, smokeless) Alcohol use: rare Denies recreational drug use   Physical Exam:  BMI of 36.5  General: no acute distress, appears stated age Neurologic: alert, answering questions appropriately, following commands Respiratory: unlabored breathing on room air, symmetric chest rise Psychiatric: appropriate affect, normal cadence to speech   MSK (spine):  -Strength exam      Left  Right EHL    5/5  5/5 TA    5/5  5/5 GSC    5/5  5/5 Knee extension  5/5  5/5 Hip flexion   5/5  5/5  -Sensory exam    Sensation intact to light touch in L3-S1 nerve distributions of bilateral lower extremities   Imaging: XR of the lumbar spine from 07/27/2022 was independently reviewed and interpreted, showing no significant degenerative changes. Possible pars defect at L5/S1. No fracture or dislocation seen. No evidence of instability on flexion/extension views.   CT of the ab/pelvis from 05/17/2011 demonstrated bilateral pars defects at L5/S1   Patient name: Brittany Hart Patient MRN: [id number] Date of visit: 09/23/22

## 2022-10-11 ENCOUNTER — Ambulatory Visit (INDEPENDENT_AMBULATORY_CARE_PROVIDER_SITE_OTHER): Payer: BC Managed Care – PPO | Admitting: Physician Assistant

## 2022-10-11 ENCOUNTER — Encounter: Payer: Self-pay | Admitting: Physician Assistant

## 2022-10-11 DIAGNOSIS — F418 Other specified anxiety disorders: Secondary | ICD-10-CM | POA: Diagnosis not present

## 2022-10-11 DIAGNOSIS — F900 Attention-deficit hyperactivity disorder, predominantly inattentive type: Secondary | ICD-10-CM | POA: Diagnosis not present

## 2022-10-11 MED ORDER — LISDEXAMFETAMINE DIMESYLATE 40 MG PO CAPS
40.0000 mg | ORAL_CAPSULE | ORAL | 0 refills | Status: DC
Start: 2022-10-11 — End: 2022-11-22

## 2022-10-11 NOTE — Progress Notes (Signed)
[facility]  Patient ID: Brittany Hart,  MRN: [id number]  PCP: System, Provider Not In  Date of Evaluation: 10/11/2022 time spent:20 minutes  Chief Complaint:  Chief Complaint   ADHD; Follow-up    HISTORY/CURRENT STATUS: HPI for routine med check.  Has been started on Contrave a few months ago for appetite suppression.  It's helped and she's lost about 6-7 #.   She does not feel like the Adderall is helping at all anymore.  Wonders if the different generics play apart.  She still has a very hard time staying on task.  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  She also has low motivation, does not feel like doing much of anything except work.  Not having panic attacks but does get anxious just because she has a hard time staying on task and that makes her nervous.  She does not feel depressed.  No extreme sadness, tearfulness, or feelings of hopelessness.  Sleeps well most of the time. ADLs and personal hygiene are normal.  Denies suicidal or homicidal thoughts.  Patient denies increased energy with decreased need for sleep, increased talkativeness, racing thoughts, impulsivity or risky behaviors, increased spending, increased libido, grandiosity, increased irritability or anger, paranoia, or hallucinations.  Denies dizziness, syncope, seizures, numbness, tingling, tremor, tics, unsteady gait, slurred speech, confusion. Denies muscle or joint pain, stiffness, or dystonia.  Individual Medical History/ Review of Systems: Changes? :No    Past medications for mental health diagnoses include: Adzenys (when Adderall shortage)  Allergies: Patient has no known allergies.  Current Medications:  Current Outpatient Medications:    amphetamine-dextroamphetamine (ADDERALL XR) 25 MG 24 hr capsule, Take 1 capsule by mouth every morning., Disp: 30 capsule, Rfl: 0   amphetamine-dextroamphetamine (ADDERALL XR) 25 MG 24 hr capsule,  Take 1 capsule by mouth every morning., Disp: 30 capsule, Rfl: 0   amphetamine-dextroamphetamine (ADDERALL XR) 25 MG 24 hr capsule, Take 1 capsule by mouth every morning., Disp: 30 capsule, Rfl: 0   amphetamine-dextroamphetamine (ADDERALL) 10 MG tablet, Take 1 tablet (10 mg total) by mouth daily in the afternoon., Disp: 30 tablet, Rfl: 0   CONTRAVE 8-90 MG TB12, 1 tablet Orally Once a day for 30 days, Disp: , Rfl:    cyclobenzaprine (FLEXERIL) 5 MG tablet, 1 tablet Orally three times a day as needed for 7 days, Disp: , Rfl:    lisdexamfetamine (VYVANSE) 40 MG capsule, Take 1 capsule (40 mg total) by mouth every morning., Disp: 30 capsule, Rfl: 0   norethindrone (MICRONOR) 0.35 MG tablet, TAKE 1 TABLET BY MOUTH EVERY DAY, Disp: 84 tablet, Rfl: 0   vitamin B-12 (CYANOCOBALAMIN) 500 MCG tablet, TAKE 1 TABLET BY MOUTH EVERY DAY FOR 30 DAYS, Disp: , Rfl:    VITAMIN D PO, Take 1 tablet by mouth daily., Disp: , Rfl:    amphetamine-dextroamphetamine (ADDERALL) 10 MG tablet, Take 1 tablet (10 mg total) by mouth daily in the afternoon. (Patient not taking: Reported on 04/22/2022), Disp: 30 tablet, Rfl: 0   amphetamine-dextroamphetamine (ADDERALL) 10 MG tablet, Take 1 tablet (10 mg total) by mouth daily in the afternoon. (Patient not taking: Reported on 04/22/2022), Disp: 30 tablet, Rfl: 0 Medication Side Effects: none  Family Medical/ Social History: Changes? No  MENTAL HEALTH EXAM:  There were no vitals taken for this visit.There is no height or weight on file to calculate BMI.  General Appearance: Casual, Neat and Well Groomed  Eye Contact:  Good  Speech:  Clear and Coherent  Volume:  Normal  Mood:  Euthymic  Affect:  Appropriate  Thought Process:  Goal Directed and Descriptions of Associations: Circumstantial  Orientation:  Full (Time, Place, and Person)  Thought Content: Logical   Suicidal Thoughts:  No  Homicidal Thoughts:  No  Memory:  WNL  Judgement:  Good  Insight:  Good  Psychomotor  Activity:  Normal  Concentration:  Concentration: Fair and Attention Span: Poor  Recall:  Good  Fund of Knowledge: Good  Language: Good  Assets:  Desire for Improvement Financial Resources/Insurance Housing Transportation Vocational/Educational  ADL's:  Intact  Cognition: WNL  Prognosis:  Good   DIAGNOSES:    ICD-10-CM   1. Attention deficit hyperactivity disorder (ADHD), predominantly inattentive type  F90.0     2. Situational anxiety  F41.8      Receiving Psychotherapy: No   RECOMMENDATIONS:  PDMP was reviewed.  Last Adderall XR filled 09/17/2022. I provided 20  minutes of face to face time during this encounter, including time spent before and after the visit in records review, medical decision making, counseling pertinent to today's visit, and charting.   Discussed different options for treatment of ADD.  I recommend we switch to Vyvanse.  Benefits, risks, and side effects were discussed and she would like to try it.    Discontinue Adderall. Start Vyvanse 40 mg, 1 p.o. daily. Refer to Dr. Clayborn Heron, for neuropsychological testing. Continue vitamins.  Return in 4 weeks.   Melony Overly, PA-C

## 2022-10-15 DIAGNOSIS — L989 Disorder of the skin and subcutaneous tissue, unspecified: Secondary | ICD-10-CM | POA: Diagnosis not present

## 2022-10-20 DIAGNOSIS — E559 Vitamin D deficiency, unspecified: Secondary | ICD-10-CM | POA: Diagnosis not present

## 2022-10-20 DIAGNOSIS — Z23 Encounter for immunization: Secondary | ICD-10-CM | POA: Diagnosis not present

## 2022-10-20 DIAGNOSIS — Z131 Encounter for screening for diabetes mellitus: Secondary | ICD-10-CM | POA: Diagnosis not present

## 2022-10-20 DIAGNOSIS — Z0000 Encounter for general adult medical examination without abnormal findings: Secondary | ICD-10-CM | POA: Diagnosis not present

## 2022-10-20 DIAGNOSIS — E782 Mixed hyperlipidemia: Secondary | ICD-10-CM | POA: Diagnosis not present

## 2022-10-20 DIAGNOSIS — E669 Obesity, unspecified: Secondary | ICD-10-CM | POA: Diagnosis not present

## 2022-11-01 DIAGNOSIS — D2239 Melanocytic nevi of other parts of face: Secondary | ICD-10-CM | POA: Diagnosis not present

## 2022-11-16 ENCOUNTER — Ambulatory Visit (INDEPENDENT_AMBULATORY_CARE_PROVIDER_SITE_OTHER): Payer: Self-pay | Admitting: Physician Assistant

## 2022-11-16 DIAGNOSIS — Z91199 Patient's noncompliance with other medical treatment and regimen due to unspecified reason: Secondary | ICD-10-CM

## 2022-11-16 NOTE — Progress Notes (Signed)
No show

## 2022-11-19 DIAGNOSIS — Z6835 Body mass index (BMI) 35.0-35.9, adult: Secondary | ICD-10-CM | POA: Diagnosis not present

## 2022-11-22 ENCOUNTER — Telehealth: Payer: Self-pay | Admitting: Physician Assistant

## 2022-11-22 ENCOUNTER — Other Ambulatory Visit: Payer: Self-pay

## 2022-11-22 NOTE — Telephone Encounter (Signed)
PENDED

## 2022-11-22 NOTE — Telephone Encounter (Signed)
Pt called and made an appt for 11/4. She needs a refill on her vyvanse 40 mg. [facility] [address]

## 2022-11-23 MED ORDER — LISDEXAMFETAMINE DIMESYLATE 40 MG PO CAPS: 40.0000 mg | ORAL_CAPSULE | ORAL | 0 refills | Status: DC

## 2022-12-06 ENCOUNTER — Encounter: Payer: Self-pay | Admitting: Physician Assistant

## 2022-12-06 ENCOUNTER — Ambulatory Visit (INDEPENDENT_AMBULATORY_CARE_PROVIDER_SITE_OTHER): Payer: BC Managed Care – PPO | Admitting: Physician Assistant

## 2022-12-06 DIAGNOSIS — F900 Attention-deficit hyperactivity disorder, predominantly inattentive type: Secondary | ICD-10-CM | POA: Diagnosis not present

## 2022-12-06 DIAGNOSIS — F418 Other specified anxiety disorders: Secondary | ICD-10-CM

## 2022-12-06 MED ORDER — LISDEXAMFETAMINE DIMESYLATE 40 MG PO CAPS: 40.0000 mg | ORAL_CAPSULE | ORAL | 0 refills | Status: DC

## 2022-12-06 MED ORDER — LISDEXAMFETAMINE DIMESYLATE 40 MG PO CAPS
40.0000 mg | ORAL_CAPSULE | ORAL | 0 refills | Status: DC
Start: 2023-01-21 — End: 2023-04-25

## 2022-12-06 NOTE — Progress Notes (Unsigned)
[facility]  Patient ID: Flo Berroa,  MRN: [id number]  PCP: System, Provider Not In  Date of Evaluation: 12/06/2022 time spent: 22 minutes  Chief Complaint:  Chief Complaint   ADD; Follow-up    HISTORY/CURRENT STATUS: HPI for routine med check.  Vyvanse is working better than the Adderall did as far as the      Denies dizziness, syncope, seizures, numbness, tingling, tremor, tics, unsteady gait, slurred speech, confusion. Denies muscle or joint pain, stiffness, or dystonia.  Individual Medical History/ Review of Systems: Changes? :No    Past medications for mental health diagnoses include: Adzenys (when Adderall shortage)  Allergies: Patient has no known allergies.  Current Medications:  Current Outpatient Medications:    cyclobenzaprine (FLEXERIL) 5 MG tablet, 1 tablet Orally three times a day as needed for 7 days, Disp: , Rfl:    [START ON 01/21/2023] lisdexamfetamine (VYVANSE) 40 MG capsule, Take 1 capsule (40 mg total) by mouth every morning., Disp: 30 capsule, Rfl: 0   [START ON 02/20/2023] lisdexamfetamine (VYVANSE) 40 MG capsule, Take 1 capsule (40 mg total) by mouth every morning., Disp: 30 capsule, Rfl: 0   LOMAIRA 8 MG TABS, Take 1 tablet by mouth daily., Disp: , Rfl:    norethindrone (MICRONOR) 0.35 MG tablet, TAKE 1 TABLET BY MOUTH EVERY DAY, Disp: 84 tablet, Rfl: 0   topiramate (TOPAMAX) 25 MG tablet, Take 25 mg by mouth daily., Disp: , Rfl:    vitamin B-12 (CYANOCOBALAMIN) 500 MCG tablet, TAKE 1 TABLET BY MOUTH EVERY DAY FOR 30 DAYS, Disp: , Rfl:    VITAMIN D PO, Take 1 tablet by mouth daily., Disp: , Rfl:    CONTRAVE 8-90 MG TB12, 1 tablet Orally Once a day for 30 days (Patient not taking: Reported on 12/06/2022), Disp: , Rfl:    [START ON 12/23/2022] lisdexamfetamine (VYVANSE) 40 MG capsule, Take 1 capsule (40 mg total) by mouth every morning., Disp: 30 capsule, Rfl: 0 Medication Side Effects: none  Family Medical/ Social History: Changes?  No  MENTAL HEALTH EXAM:  There were no vitals taken for this visit.There is no height or weight on file to calculate BMI.  General Appearance: Casual, Neat and Well Groomed  Eye Contact:  Good  Speech:  Clear and Coherent  Volume:  Normal  Mood:  Euthymic  Affect:  Appropriate  Thought Process:  Goal Directed and Descriptions of Associations: Circumstantial  Orientation:  Full (Time, Place, and Person)  Thought Content: Logical   Suicidal Thoughts:  No  Homicidal Thoughts:  No  Memory:  WNL  Judgement:  Good  Insight:  Good  Psychomotor Activity:  Normal  Concentration:  Concentration: Poor and Attention Span: Poor  Recall:  Good  Fund of Knowledge: Good  Language: Good  Assets:  Desire for Improvement Financial Resources/Insurance Housing Transportation Vocational/Educational  ADL's:  Intact  Cognition: WNL  Prognosis:  Good   DIAGNOSES:    ICD-10-CM   1. Attention deficit hyperactivity disorder (ADHD), predominantly inattentive type  F90.0     2. Situational anxiety  F41.8      Receiving Psychotherapy: No   RECOMMENDATIONS:  PDMP was reviewed.  Last Vyvanse filled 11/23/2022.  Lomaira filled 11/25/2022.   I provided 22 minutes of face to face time during this encounter, including time spent before and after the visit in records review, medical decision making, counseling pertinent to today's visit, and charting.   Continue Vyvanse 40 mg, 1 q am. Continue vitamins.  Return in 3 months.  Melony Overly, PA-C

## 2023-01-17 DIAGNOSIS — E66811 Obesity, class 1: Secondary | ICD-10-CM | POA: Diagnosis not present

## 2023-03-08 ENCOUNTER — Ambulatory Visit (INDEPENDENT_AMBULATORY_CARE_PROVIDER_SITE_OTHER): Payer: Self-pay | Admitting: Physician Assistant

## 2023-03-08 DIAGNOSIS — Z91199 Patient's noncompliance with other medical treatment and regimen due to unspecified reason: Secondary | ICD-10-CM

## 2023-03-08 NOTE — Progress Notes (Signed)
 No show

## 2023-03-25 ENCOUNTER — Telehealth: Payer: Self-pay | Admitting: Physician Assistant

## 2023-03-25 ENCOUNTER — Other Ambulatory Visit: Payer: Self-pay

## 2023-03-25 MED ORDER — LISDEXAMFETAMINE DIMESYLATE 40 MG PO CAPS: 40.0000 mg | ORAL_CAPSULE | ORAL | 0 refills | Status: DC

## 2023-03-25 NOTE — Telephone Encounter (Signed)
 Next appt is 05/02/23. She has two left. Requesting refill for Vyvanse 40 mg called to:  [id number] [place], [place] - [address]   Phone: [phone]  Fax: [phone]    It is in stock at this location.

## 2023-03-25 NOTE — Telephone Encounter (Signed)
 Pended vyvnase 40 mg to [facility].

## 2023-03-25 NOTE — Telephone Encounter (Signed)
 Please let her know this is the last time I'm filling this unless she's seen. She's missed 2 appts. Thanks

## 2023-04-20 ENCOUNTER — Ambulatory Visit: Payer: BC Managed Care – PPO | Admitting: Physician Assistant

## 2023-04-25 ENCOUNTER — Telehealth: Payer: Self-pay | Admitting: Physician Assistant

## 2023-04-25 ENCOUNTER — Other Ambulatory Visit: Payer: Self-pay

## 2023-04-25 NOTE — Telephone Encounter (Signed)
 Pended [facility]se to [facility] [address]

## 2023-04-25 NOTE — Telephone Encounter (Signed)
 Pt called asking for a refill on her vyvanse 40 mg. [facility] [address] Her next appt is [id number]

## 2023-04-26 MED ORDER — LISDEXAMFETAMINE DIMESYLATE 40 MG PO CAPS: 40.0000 mg | ORAL_CAPSULE | ORAL | 0 refills | Status: DC

## 2023-05-02 ENCOUNTER — Ambulatory Visit: Payer: BC Managed Care – PPO | Admitting: Physician Assistant

## 2023-05-20 DIAGNOSIS — Z20828 Contact with and (suspected) exposure to other viral communicable diseases: Secondary | ICD-10-CM | POA: Diagnosis not present

## 2023-05-20 DIAGNOSIS — R5383 Other fatigue: Secondary | ICD-10-CM | POA: Diagnosis not present

## 2023-05-20 DIAGNOSIS — B349 Viral infection, unspecified: Secondary | ICD-10-CM | POA: Diagnosis not present

## 2023-05-26 ENCOUNTER — Other Ambulatory Visit: Payer: Self-pay

## 2023-05-26 ENCOUNTER — Telehealth: Payer: Self-pay | Admitting: Physician Assistant

## 2023-05-26 MED ORDER — LISDEXAMFETAMINE DIMESYLATE 40 MG PO CAPS: 40.0000 mg | ORAL_CAPSULE | ORAL | 0 refills | Status: DC

## 2023-05-26 NOTE — Telephone Encounter (Signed)
 [facility] [address]

## 2023-05-26 NOTE — Telephone Encounter (Signed)
 Pt just called requesting Rx for Vyvanse  40 mg to [facility] [address]

## 2023-06-12 DIAGNOSIS — M62838 Other muscle spasm: Secondary | ICD-10-CM | POA: Diagnosis not present

## 2023-06-12 DIAGNOSIS — M542 Cervicalgia: Secondary | ICD-10-CM | POA: Diagnosis not present

## 2023-06-12 DIAGNOSIS — M25522 Pain in left elbow: Secondary | ICD-10-CM | POA: Diagnosis not present

## 2023-06-23 ENCOUNTER — Telehealth: Payer: Self-pay | Admitting: Physician Assistant

## 2023-06-23 ENCOUNTER — Ambulatory Visit: Admitting: Physician Assistant

## 2023-06-23 NOTE — Telephone Encounter (Signed)
 LF 4/24, due 5/23

## 2023-06-23 NOTE — Telephone Encounter (Signed)
 Pt's appt was cancelled today because Ammon Bales is sick.  Previous appt in March was also cancelled due to Ammon Bales being sick.  Pt called to [facility] and said she will be out of Vyvanse  40mg  on Sunday.  Pls send script to   [id number] [place] [facility] - [address] [place], [place] [address] Phone: [phone]  Fax: [phone]   Next appt 7/7

## 2023-06-24 ENCOUNTER — Other Ambulatory Visit: Payer: Self-pay

## 2023-06-24 MED ORDER — LISDEXAMFETAMINE DIMESYLATE 40 MG PO CAPS: 40.0000 mg | ORAL_CAPSULE | ORAL | 0 refills | Status: DC

## 2023-06-24 NOTE — Telephone Encounter (Signed)
 Pended [facility]se  [facility] to [facility] [address]

## 2023-06-29 ENCOUNTER — Ambulatory Visit (INDEPENDENT_AMBULATORY_CARE_PROVIDER_SITE_OTHER): Admitting: Obstetrics and Gynecology

## 2023-06-29 ENCOUNTER — Other Ambulatory Visit (HOSPITAL_COMMUNITY)
Admission: RE | Admit: 2023-06-29 | Discharge: 2023-06-29 | Disposition: A | Discharge status: Home / Self Care | Source: Ambulatory Visit | Attending: Obstetrics and Gynecology | Admitting: Obstetrics and Gynecology

## 2023-06-29 ENCOUNTER — Encounter: Payer: Self-pay | Admitting: Obstetrics and Gynecology

## 2023-06-29 VITALS — BP 116/80 | HR 96 | Ht 63.0 in | Wt 212.0 lb

## 2023-06-29 DIAGNOSIS — Z1331 Encounter for screening for depression: Secondary | ICD-10-CM | POA: Diagnosis not present

## 2023-06-29 DIAGNOSIS — Z1231 Encounter for screening mammogram for malignant neoplasm of breast: Secondary | ICD-10-CM

## 2023-06-29 DIAGNOSIS — E041 Nontoxic single thyroid nodule: Secondary | ICD-10-CM | POA: Diagnosis not present

## 2023-06-29 DIAGNOSIS — N946 Dysmenorrhea, unspecified: Secondary | ICD-10-CM

## 2023-06-29 DIAGNOSIS — Z01419 Encounter for gynecological examination (general) (routine) without abnormal findings: Secondary | ICD-10-CM | POA: Insufficient documentation

## 2023-06-29 DIAGNOSIS — N920 Excessive and frequent menstruation with regular cycle: Secondary | ICD-10-CM | POA: Diagnosis not present

## 2023-06-29 MED ORDER — TIRZEPATIDE-WEIGHT MANAGEMENT 2.5 MG/0.5ML ~~LOC~~ SOLN
2.5000 mg | SUBCUTANEOUS | 1 refills | Status: DC
Start: 2023-06-29 — End: 2023-10-13

## 2023-06-29 NOTE — Patient Instructions (Addendum)
 Referral placed for vaginal ultrasound in the office to evaluate for causes of the heavy bleeding.  I can also look at labs as well, if you would like ie thyroid , anemia, etc.  I ordered a thyroid  ultrasound for possible nodule on the left side.  Radiology will call you  Mammogram referral placed.  Make sure to repeat every year  Colonoscopy: starts at age 43 now, but if you have a family history, then earlier  Heavy bleeding.  Brittany Hart will call to discuss costs, dates etc.  [email or web address] is a great blog site for women who have the robotic hysterectomy. We should do an endometrial biopsy before surgery as well.  Weight loss: Keep moving, watch diet.  Look on [employer] for coupon.  Call your insurance to see any benefit with the coupon.  I have sent the starting dose to [employer] for self pay.  This is an injection once a week.  We can go up each month.  Return in one month for a weight check.   Pap smear has been sent.  Try to get a multivit daily and calicum and vit D

## 2023-06-29 NOTE — Progress Notes (Signed)
 43 y.o. y.o. female here for annual exam. Patient's last menstrual period was 06/15/2023 (exact date). Period Duration (Days): 3-6 Period Pattern: Regular Menstrual Flow: Heavy (heavy for 2 days) Menstrual Control: Maxi pad, Tampon Dysmenorrhea: (!) Mild Dysmenorrhea Symptoms: Cramping, Nausea, Diarrhea, Headache   Periods are heavy and painful.  Tried several different ocp's in the past for the bleeding with no success.  Feels drained by them.  Has history of anemia  Bleeds 1-2 pads an hour. 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  They do not desire more children and she is having menopausal symptoms such as fatigue, weight gain, joint pain, doesn't feel like herself. Hard to focus or finish a task, even with vyvanz, memory fog    Weight gain is bothersome. Has tried conclave, diet and exercise with no success.  She would like to try zep bound and knows her insurance will not cover, but will search for a coupon.  MMG 2023 Colonoscopy: to begin at age 55 Dxa: none HRT: none No Gi/GU complaints  Body mass index is 37.55 kg/m.     06/29/2023    2:41 PM  Depression screen PHQ 2/9  Decreased Interest 0  Down, Depressed, Hopeless 0  PHQ - 2 Score 0    Blood pressure 116/80, pulse 96, height 5\' 3"  (1.6 m), weight 212 lb (96.2 kg), last menstrual period 06/15/2023, SpO2 97%.     Component Value Date/Time   DIAGPAP  05/25/2021 1632    - Negative for intraepithelial lesion or malignancy (NILM)   DIAGPAP  10/13/2016 0000    NEGATIVE FOR INTRAEPITHELIAL LESIONS OR MALIGNANCY.   HPVHIGH Negative 05/25/2021 1632   ADEQPAP  05/25/2021 1632    Satisfactory for evaluation; transformation zone component PRESENT.   ADEQPAP  10/13/2016 0000    Satisfactory for evaluation  endocervical/transformation zone component PRESENT.    GYN HISTORY:    Component Value Date/Time   DIAGPAP  05/25/2021 1632    -  Negative for intraepithelial lesion or malignancy (NILM)   DIAGPAP  10/13/2016 0000    NEGATIVE FOR INTRAEPITHELIAL LESIONS OR MALIGNANCY.   HPVHIGH Negative 05/25/2021 1632   ADEQPAP  05/25/2021 1632    Satisfactory for evaluation; transformation zone component PRESENT.   ADEQPAP  10/13/2016 0000    Satisfactory for evaluation  endocervical/transformation zone component PRESENT.    OB History  Gravida Para Term Preterm AB Living  2 2 2   2   SAB IAB Ectopic Multiple Live Births      2    # Outcome Date GA Lbr Len/2nd Weight Sex Type Anes PTL Lv  2 Term      Vag-Spont   LIV  1 Term      Vag-Spont   LIV    Past Medical History:  Diagnosis Date   ADD (attention deficit disorder)    Anxiety    Kidney stone    Ocular migraine    Renal calculus or stone    CURRENT   Vitamin D  deficiency 03/07/2020    Past Surgical History:  Procedure Laterality Date   ESOPHAGEAL DILATION     URETEROSCOPY  06/04/2011   Procedure: URETEROSCOPY;  Surgeon: Alanson Alliance, MD;  Location: [facility];  Service: Urology;  Laterality: Left;  cystoscopy, Left ureteroscopy with lithotripsy holmium laser    Current Outpatient Medications on File Prior to Visit  Medication Sig Dispense Refill  cyclobenzaprine (FLEXERIL) 5 MG tablet 1 tablet Orally three times a day as needed for 7 days     lisdexamfetamine (VYVANSE ) 40 MG capsule Take 1 capsule (40 mg total) by mouth every morning. 30 capsule 0   lisdexamfetamine (VYVANSE ) 40 MG capsule Take 1 capsule (40 mg total) by mouth every morning. 30 capsule 0   vitamin B-12 (CYANOCOBALAMIN ) 500 MCG tablet TAKE 1 TABLET BY MOUTH EVERY DAY FOR 30 DAYS     VITAMIN D  PO Take 1 tablet by mouth daily.     No current facility-administered medications on file prior to visit.    Social History   Socioeconomic History   Marital status: Married    Spouse name: Micheal   Number of children: 2   Years of education: 14   Highest education level:  Associate degree: occupational, Scientist, product/process development, or vocational program  Occupational History   Occupation: preschool    Comment: 43 yo  Tobacco Use   Smoking status: Never   Smokeless tobacco: Never  Vaping Use   Vaping status: Never Used  Substance and Sexual Activity   Alcohol use: Not Currently   Drug use: No   Sexual activity: Yes    Partners: Male    Birth control/protection: Other-see comments    Comment: Huband had vasectomy  Other Topics Concern   Not on file  Social History Narrative   Patient lives at home with her husband Systems analyst) and 2 sons   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   Grew up in [place]. Only child.  Both bio parents in home. Mom was homemaker.  Dad was Psychologist, occupational.    No religious beliefs.   No legal.    Social Drivers of Health   Financial Resource Strain: Low Risk  (05/01/2018)   Overall Financial Resource Strain (CARDIA)    Difficulty of Paying Living Expenses: Not hard at all  Food Insecurity: No Food Insecurity (05/01/2018)   Hunger Vital Sign    Worried About Running Out of Food in the Last Year: Never true    Ran Out of Food in the Last Year: Never true  Transportation Needs: No Transportation Needs (05/01/2018)   PRAPARE - Administrator, Civil Service (Medical): No    Lack of Transportation (Non-Medical): No  Physical Activity: Inactive (05/01/2018)   Exercise Vital Sign    Days of Exercise per Week: 0 days    Minutes of Exercise per Session: 0 min  Stress: Not on file  Social Connections: Unknown (06/05/2021)   [facility]   Social Network    Social Network: Not on file  Intimate Partner Violence: Unknown (05/07/2021)   Received from [facility], [facility]   HITS    Physically Hurt: Not on file    Insult or Talk Down To: Not on file    Threaten Physical Harm: Not on file    Scream or Curse: Not on file    Family  History  Problem Relation Age of Onset   Spondylitis Mother    Bronchiolitis Mother    Depression Mother    Anxiety disorder Father    Depression Father    GER disease Father    Seizures Father    Breast cancer Maternal Grandmother        53s   Hypertension Maternal Grandmother    Depression Maternal Grandmother  Anxiety disorder Maternal Grandmother    Dementia Maternal Grandmother    Kidney cancer Maternal Grandfather    Breast cancer Paternal Grandmother        30s   CVA Paternal Grandfather    ADD / ADHD Son    Healthy Son      No Known Allergies    Patient's last menstrual period was Patient's last menstrual period was 06/15/2023 (exact date)..            Review of Systems Alls systems reviewed and are negative.     Physical Exam Constitutional:      Appearance: Normal appearance.     Comments: Possible left thyroid  nodule or fullness  Genitourinary:     Vulva and urethral meatus normal.     No lesions in the vagina.     Right Labia: No rash, lesions or skin changes.    Left Labia: No lesions, skin changes or rash.    No vaginal discharge or tenderness.     No vaginal prolapse present.    No vaginal atrophy present.     Right Adnexa: not tender, not palpable and no mass present.    Left Adnexa: not tender, not palpable and no mass present.    No cervical motion tenderness or discharge.     Uterus is not enlarged, tender or irregular.  Breasts:    Right: Normal.     Left: Normal.  HENT:     Head: Normocephalic.  Neck:     Thyroid : No thyroid  mass, thyromegaly or thyroid  tenderness.  Cardiovascular:     Rate and Rhythm: Normal rate and regular rhythm.     Heart sounds: Normal heart sounds, S1 normal and S2 normal.  Pulmonary:     Effort: Pulmonary effort is normal.     Breath sounds: Normal breath sounds and air entry.  Abdominal:     General: There is no distension.     Palpations: Abdomen is soft. There is no mass.     Tenderness: There is no  abdominal tenderness. There is no guarding or rebound.  Musculoskeletal:        General: Normal range of motion.     Cervical back: Full passive range of motion without pain, normal range of motion and neck supple. No tenderness.     Right lower leg: No edema.     Left lower leg: No edema.  Neurological:     Mental Status: She is alert.  Skin:    General: Skin is warm.  Psychiatric:        Mood and Affect: Mood normal.        Behavior: Behavior normal.        Thought Content: Thought content normal.  Vitals and nursing note reviewed. Exam conducted with a chaperone present.       A:         Well Woman GYN exam                             P:        Pap smear collected today Encouraged annual mammogram screening Colon cancer screening not indicated DXA not indicated Labs and immunizations to do with PMD Discussed breast self exams Encouraged healthy lifestyle practices Encouraged Vit D and Calcium  Weight loss: encouraged continued diet and exercise. Counseled on zep bound and to send to [employer] for self pay, unless she can find a coupon offer. RTC  in one month from starting for a weight check Thyroid  US  placed to evaluate possible thyroid  nodule Menorrhagia: to get PUS counseled on all options.  She would like consider the [facility].  The procedure was reviewed in detail. PA placed.  No follow-ups on file.  Reinaldo Caras

## 2023-06-30 ENCOUNTER — Ambulatory Visit: Payer: Self-pay | Admitting: Obstetrics and Gynecology

## 2023-06-30 DIAGNOSIS — E041 Nontoxic single thyroid nodule: Secondary | ICD-10-CM

## 2023-06-30 LAB — CYTOLOGY - PAP: Diagnosis: NEGATIVE

## 2023-07-06 DIAGNOSIS — H16223 Keratoconjunctivitis sicca, not specified as Sjogren's, bilateral: Secondary | ICD-10-CM | POA: Diagnosis not present

## 2023-07-12 ENCOUNTER — Ambulatory Visit
Admission: RE | Admit: 2023-07-12 | Discharge: 2023-07-12 | Disposition: A | Discharge status: Home / Self Care | Source: Ambulatory Visit | Attending: Obstetrics and Gynecology | Admitting: Obstetrics and Gynecology

## 2023-07-12 DIAGNOSIS — Z01419 Encounter for gynecological examination (general) (routine) without abnormal findings: Secondary | ICD-10-CM

## 2023-07-12 DIAGNOSIS — Z1231 Encounter for screening mammogram for malignant neoplasm of breast: Secondary | ICD-10-CM

## 2023-07-28 ENCOUNTER — Ambulatory Visit (INDEPENDENT_AMBULATORY_CARE_PROVIDER_SITE_OTHER): Admitting: Obstetrics and Gynecology

## 2023-07-28 ENCOUNTER — Ambulatory Visit (INDEPENDENT_AMBULATORY_CARE_PROVIDER_SITE_OTHER)

## 2023-07-28 ENCOUNTER — Encounter: Payer: Self-pay | Admitting: Obstetrics and Gynecology

## 2023-07-28 VITALS — BP 108/70 | HR 88 | Wt 206.0 lb

## 2023-07-28 DIAGNOSIS — R935 Abnormal findings on diagnostic imaging of other abdominal regions, including retroperitoneum: Secondary | ICD-10-CM | POA: Diagnosis not present

## 2023-07-28 DIAGNOSIS — N920 Excessive and frequent menstruation with regular cycle: Secondary | ICD-10-CM

## 2023-07-28 DIAGNOSIS — N946 Dysmenorrhea, unspecified: Secondary | ICD-10-CM | POA: Diagnosis not present

## 2023-07-28 DIAGNOSIS — Z01419 Encounter for gynecological examination (general) (routine) without abnormal findings: Secondary | ICD-10-CM

## 2023-07-28 MED ORDER — TIRZEPATIDE-WEIGHT MANAGEMENT 2.5 MG/0.5ML ~~LOC~~ SOLN: 5.0000 mg | SUBCUTANEOUS | 2 refills | Status: DC

## 2023-07-28 NOTE — Progress Notes (Signed)
 43 y.o. y.o. female here for PUS results Patient's last menstrual period was 07/18/2023 (exact date).     Periods are heavy and painful.  Tried several different ocp's in the past for the bleeding with no success.  Feels drained by them.  Has history of anemia  Bleeds 1-2 pads an hour. 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  They do not desire more children and she is having menopausal symptoms such as fatigue, weight gain, joint pain, doesn't feel like herself. Hard to focus or finish a task, even with vyvanz, memory fog She is planning on the [facility] in September  Radiology has not called to schedule her thryoid US   Weight gain is bothersome. Has tried conclave, diet and exercise with no success.  She would like to try zep bound and is on her 3rd week and going through [employer]. She is watching her intake and walking more. Having some reflux  MMG 2023 Colonoscopy: to begin at age 35 Dxa: none HRT: none No Gi/GU complaints  Body mass index is 36.49 kg/m. Blood pressure 108/70, pulse 88, weight 206 lb (93.4 kg), last menstrual period 07/18/2023, SpO2 98%. Last visit 212lbs     06/29/2023    2:41 PM  Depression screen PHQ 2/9  Decreased Interest 0  Down, Depressed, Hopeless 0  PHQ - 2 Score 0    Blood pressure 108/70, pulse 88, weight 206 lb (93.4 kg), last menstrual period 07/18/2023, SpO2 98%.     Component Value Date/Time   DIAGPAP  06/29/2023 1605    - Negative for intraepithelial lesion or malignancy (NILM)   DIAGPAP  05/25/2021 1632    - Negative for intraepithelial lesion or malignancy (NILM)   DIAGPAP  10/13/2016 0000    NEGATIVE FOR INTRAEPITHELIAL LESIONS OR MALIGNANCY.   HPVHIGH Negative 05/25/2021 1632   ADEQPAP  06/29/2023 1605    Satisfactory for evaluation; transformation zone component PRESENT.   ADEQPAP  05/25/2021 1632    Satisfactory for evaluation; transformation zone  component PRESENT.   ADEQPAP  10/13/2016 0000    Satisfactory for evaluation  endocervical/transformation zone component PRESENT.    GYN HISTORY:    Component Value Date/Time   DIAGPAP  06/29/2023 1605    - Negative for intraepithelial lesion or malignancy (NILM)   DIAGPAP  05/25/2021 1632    - Negative for intraepithelial lesion or malignancy (NILM)   DIAGPAP  10/13/2016 0000    NEGATIVE FOR INTRAEPITHELIAL LESIONS OR MALIGNANCY.   HPVHIGH Negative 05/25/2021 1632   ADEQPAP  06/29/2023 1605    Satisfactory for evaluation; transformation zone component PRESENT.   ADEQPAP  05/25/2021 1632    Satisfactory for evaluation; transformation zone component PRESENT.   ADEQPAP  10/13/2016 0000    Satisfactory for evaluation  endocervical/transformation zone component PRESENT.    OB History  Gravida Para Term Preterm AB Living  2 2 2   2   SAB IAB Ectopic Multiple Live Births      2    # Outcome Date GA Lbr Len/2nd Weight Sex Type Anes PTL Lv  2 Term      Vag-Spont   LIV  1 Term      Vag-Spont   LIV    Past Medical History:  Diagnosis Date   ADD (attention deficit disorder)    Anxiety    Kidney stone    Ocular migraine    Renal calculus  or stone    CURRENT   Vitamin D  deficiency 03/07/2020    Past Surgical History:  Procedure Laterality Date   ESOPHAGEAL DILATION     URETEROSCOPY  06/04/2011   Procedure: URETEROSCOPY;  Surgeon: Mark C Ottelin, MD;  [facility];  Service: Urology;  Laterality: Left;  cystoscopy, Left ureteroscopy with lithotripsy holmium laser    Current Outpatient Medications on File Prior to Visit  Medication Sig Dispense Refill   cyclobenzaprine (FLEXERIL) 5 MG tablet 1 tablet Orally three times a day as needed for 7 days     lisdexamfetamine (VYVANSE ) 40 MG capsule Take 1 capsule (40 mg total) by mouth every morning. 30 capsule 0   vitamin B-12 (CYANOCOBALAMIN ) 500 MCG tablet TAKE 1 TABLET BY MOUTH EVERY DAY FOR 30 DAYS      VITAMIN D  PO Take 1 tablet by mouth daily.     No current facility-administered medications on file prior to visit.    Social History   Socioeconomic History   Marital status: Married    Spouse name: Micheal   Number of children: 2   Years of education: 14   Highest education level: Associate degree: occupational, Scientist, product/process development, or vocational program  Occupational History   Occupation: preschool    Comment: 43 yo  Tobacco Use   Smoking status: Never   Smokeless tobacco: Never  Vaping Use   Vaping status: Never Used  Substance and Sexual Activity   Alcohol use: Not Currently   Drug use: No   Sexual activity: Yes    Partners: Male    Birth control/protection: Other-see comments    Comment: Huband had vasectomy  Other Topics Concern   Not on file  Social History Narrative   Patient lives at home with her husband Systems analyst) and 2 sons   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   Grew up in [place]. Only child.  Both bio parents in home. Mom was homemaker.  Dad was Psychologist, occupational.    No religious beliefs.   No legal.    Social Drivers of Health   Financial Resource Strain: Low Risk  (05/01/2018)   Overall Financial Resource Strain (CARDIA)    Difficulty of Paying Living Expenses: Not hard at all  Food Insecurity: No Food Insecurity (05/01/2018)   Hunger Vital Sign    Worried About Running Out of Food in the Last Year: Never true    Ran Out of Food in the Last Year: Never true  Transportation Needs: No Transportation Needs (05/01/2018)   PRAPARE - Administrator, Civil Service (Medical): No    Lack of Transportation (Non-Medical): No  Physical Activity: Inactive (05/01/2018)   Exercise Vital Sign    Days of Exercise per Week: 0 days    Minutes of Exercise per Session: 0 min  Stress: Not on file  Social Connections: Unknown (06/05/2021)   Received from [facility]   Social Network    Social  Network: Not on file  Intimate Partner Violence: Unknown (05/07/2021)   Received from [facility]   HITS    Physically Hurt: Not on file    Insult or Talk Down To: Not on file    Threaten Physical Harm: Not on file    Scream or Curse: Not on file    Family History  Problem Relation Age of Onset   Spondylitis Mother  Bronchiolitis Mother    Depression Mother    Anxiety disorder Father    Depression Father    GER disease Father    Seizures Father    Breast cancer Maternal Grandmother        26s   Hypertension Maternal Grandmother    Depression Maternal Grandmother    Anxiety disorder Maternal Grandmother    Dementia Maternal Grandmother    Kidney cancer Maternal Grandfather    Breast cancer Paternal Grandmother        30s   CVA Paternal Grandfather    ADD / ADHD Son    Healthy Son      No Known Allergies    Patient's last menstrual period was Patient's last menstrual period was 07/18/2023 (exact date)..            Review of Systems Alls systems reviewed and are negative.    last visit: Physical Exam Constitutional:      Appearance: Normal appearance.     Comments: Possible left thyroid  nodule or fullness  Genitourinary:     Vulva and urethral meatus normal.     No lesions in the vagina.     Right Labia: No rash, lesions or skin changes.    Left Labia: No lesions, skin changes or rash.    No vaginal discharge or tenderness.     No vaginal prolapse present.    No vaginal atrophy present.     Right Adnexa: not tender, not palpable and no mass present.    Left Adnexa: not tender, not palpable and no mass present.    No cervical motion tenderness or discharge.     Uterus is not enlarged, tender or irregular.  Breasts:    Right: Normal.     Left: Normal.  HENT:     Head: Normocephalic.  Neck:     Thyroid : No thyroid  mass, thyromegaly or thyroid  tenderness.   Cardiovascular:     Rate and Rhythm: Normal rate and regular rhythm.     Heart sounds: Normal  heart sounds, S1 normal and S2 normal.  Pulmonary:     Effort: Pulmonary effort is normal.     Breath sounds: Normal breath sounds and air entry.  Abdominal:     General: There is no distension.     Palpations: Abdomen is soft. There is no mass.     Tenderness: There is no abdominal tenderness. There is no guarding or rebound.   Musculoskeletal:        General: Normal range of motion.     Cervical back: Full passive range of motion without pain, normal range of motion and neck supple. No tenderness.     Right lower leg: No edema.     Left lower leg: No edema.   Neurological:     Mental Status: She is alert.   Skin:    General: Skin is warm.   Psychiatric:        Mood and Affect: Mood normal.        Behavior: Behavior normal.        Thought Content: Thought content normal.  Vitals and nursing note reviewed. Exam conducted with a chaperone present.   10.38cm uterus No myometrial masses  Normal ovaries Endometrial lining thickened at 14.50mm avascular    A:        PUS results, weight check 3 weeks on 2.5mg  of zepbound                 P:  RLH to be scheduled in September Thickened lining on PUS today.  Counseled on the importance of the EMB. To return for this.  Counseled on the procedure and what to expect. Increase next month to 5mg  weekly of zep bound.  Counseled on continued diet and exercise. Counseled on PUS results  20 minutes spent on reviewing records, imaging,  and one on one patient time and counseling patient and documentation Dr. Glennon  No follow-ups on file.  Brittany Hart

## 2023-07-28 NOTE — Addendum Note (Signed)
 Addended by: BRUTUS KATE SAILOR on: 07/28/2023 05:07 PM   Modules accepted: Orders

## 2023-07-29 ENCOUNTER — Other Ambulatory Visit: Payer: Self-pay | Admitting: Obstetrics and Gynecology

## 2023-07-29 NOTE — Telephone Encounter (Signed)
 Pharmacy needed clarification on Zepbound  2.5 mg directions for RX written 07/04/23.  Pharmacy comment: Please clarify direction and resend to [facility]. NPI [id number] NCPDP [id number]. The prescription medication prescribed is for zepbound  2.5mg /0.5Ml vial. Direction say to inject 5mg  into the skin once a week for four doses.  Sent to provider for review.

## 2023-08-01 ENCOUNTER — Telehealth: Payer: Self-pay | Admitting: Physician Assistant

## 2023-08-01 ENCOUNTER — Other Ambulatory Visit: Payer: Self-pay

## 2023-08-01 ENCOUNTER — Ambulatory Visit
Admission: RE | Admit: 2023-08-01 | Discharge: 2023-08-01 | Disposition: A | Discharge status: Home / Self Care | Source: Ambulatory Visit | Attending: Obstetrics and Gynecology | Admitting: Obstetrics and Gynecology

## 2023-08-01 DIAGNOSIS — E042 Nontoxic multinodular goiter: Secondary | ICD-10-CM | POA: Diagnosis not present

## 2023-08-01 DIAGNOSIS — E041 Nontoxic single thyroid nodule: Secondary | ICD-10-CM

## 2023-08-01 MED ORDER — LISDEXAMFETAMINE DIMESYLATE 40 MG PO CAPS: 40.0000 mg | ORAL_CAPSULE | ORAL | 0 refills | Status: DC

## 2023-08-01 NOTE — Telephone Encounter (Signed)
 Pt lvm that she needs a refill on her vyvanse  40 mg. Pharmacy is [facility] on [address]. Appt next week

## 2023-08-01 NOTE — Telephone Encounter (Signed)
 [facility] [address]

## 2023-08-04 ENCOUNTER — Other Ambulatory Visit: Payer: Self-pay | Admitting: Obstetrics and Gynecology

## 2023-08-04 ENCOUNTER — Encounter (INDEPENDENT_AMBULATORY_CARE_PROVIDER_SITE_OTHER): Payer: Self-pay

## 2023-08-04 NOTE — Telephone Encounter (Signed)
 Note from pharmacy requesting clarification for medications and instructions on E-prescribing it back.   Pharmacy comment: Please clarify strength and directions and resend to [facility]. NPI [id number] NCPDP eScribe: [id number]. Rx sent over on 6/29 as Zepbound  2.5mg  vials and directions to inject 5mg .

## 2023-08-08 ENCOUNTER — Ambulatory Visit: Admitting: Physician Assistant

## 2023-08-08 ENCOUNTER — Encounter: Payer: Self-pay | Admitting: Physician Assistant

## 2023-08-08 DIAGNOSIS — F900 Attention-deficit hyperactivity disorder, predominantly inattentive type: Secondary | ICD-10-CM

## 2023-08-08 DIAGNOSIS — F418 Other specified anxiety disorders: Secondary | ICD-10-CM | POA: Diagnosis not present

## 2023-08-08 MED ORDER — LISDEXAMFETAMINE DIMESYLATE 40 MG PO CAPS
40.0000 mg | ORAL_CAPSULE | ORAL | 0 refills | Status: DC
Start: 2023-10-29 — End: 2023-12-06

## 2023-08-08 MED ORDER — LISDEXAMFETAMINE DIMESYLATE 40 MG PO CAPS
40.0000 mg | ORAL_CAPSULE | ORAL | 0 refills | Status: DC
Start: 2023-08-30 — End: 2023-10-19

## 2023-08-08 MED ORDER — LISDEXAMFETAMINE DIMESYLATE 40 MG PO CAPS: 40.0000 mg | ORAL_CAPSULE | ORAL | 0 refills | Status: DC

## 2023-08-08 NOTE — Progress Notes (Signed)
 [facility]  Patient ID: Brittany Hart,  MRN: [id number]  PCP: Pcp, No  Date of Evaluation: 08/08/2023 time spent:22 minutes  Chief Complaint:  Chief Complaint   ADHD; Follow-up    HISTORY/CURRENT STATUS: HPI for routine med check.  Doing well with the Vyvanse , for the most part.  Wonders if increasing the dose might be helpful.  Still has some trouble staying on task.  That ends up making her anxious.  Doesn't really want to change anything right now though, just started Zepbound  a month ago and she prefers not to make other changes.  Another physical issue is the possibility of a hysterectomy.  The endometrium is thicker than it should be so she is undergoing workup now.  Energy and motivation are good at work but afterwards she is very tired and does not want to do much of anything.  She does not cry easily.  No feelings of hopelessness.  ADLs and personal hygiene are normal.  She has lost approximately 10 pounds in the past month since being on Zepbound .  No mania, delirium, psychosis, SI/HI.  Denies dizziness, syncope, seizures, numbness, tingling, tremor, tics, unsteady gait, slurred speech, confusion. Denies muscle or joint pain, stiffness, or dystonia.  Individual Medical History/ Review of Systems: Changes? :Yes see HPI  Past medications for mental health diagnoses include: Adzenys  (when Adderall shortage)  Allergies: Patient has no known allergies.  Current Medications:  Current Outpatient Medications:    cyclobenzaprine (FLEXERIL) 5 MG tablet, 1 tablet Orally three times a day as needed for 7 days, Disp: , Rfl:    [START ON 09/29/2023] lisdexamfetamine (VYVANSE ) 40 MG capsule, Take 1 capsule (40 mg total) by mouth every morning., Disp: 30 capsule, Rfl: 0   [START ON 08/30/2023] lisdexamfetamine (VYVANSE ) 40 MG capsule, Take 1 capsule (40 mg total) by mouth every morning., Disp: 30 capsule, Rfl: 0   vitamin B-12 (CYANOCOBALAMIN ) 500 MCG tablet, TAKE 1 TABLET BY  MOUTH EVERY DAY FOR 30 DAYS, Disp: , Rfl:    VITAMIN D  PO, Take 1 tablet by mouth daily., Disp: , Rfl:    ZEPBOUND  2.5 MG/0.5ML injection vial, Inject 0.5ml under the skin once weekly, Disp: 4 mL, Rfl: 2   [START ON 10/29/2023] lisdexamfetamine (VYVANSE ) 40 MG capsule, Take 1 capsule (40 mg total) by mouth every morning., Disp: 30 capsule, Rfl: 0 Medication Side Effects: none  Family Medical/ Social History: Changes? No  MENTAL HEALTH EXAM:  Last menstrual period 07/18/2023.There is no height or weight on file to calculate BMI.  General Appearance: Casual, Neat and Well Groomed  Eye Contact:  Good  Speech:  Clear and Coherent  Volume:  Normal  Mood:  Euthymic  Affect:  Appropriate  Thought Process:  Goal Directed and Descriptions of Associations: Circumstantial  Orientation:  Full (Time, Place, and Person)  Thought Content: Logical   Suicidal Thoughts:  No  Homicidal Thoughts:  No  Memory:  WNL  Judgement:  Good  Insight:  Good  Psychomotor Activity:  Normal  Concentration:  Concentration: Good and Attention Span: Fair  Recall:  Good  Fund of Knowledge: Good  Language: Good  Assets:  Desire for Improvement Financial Resources/Insurance Housing Transportation Vocational/Educational  ADL's:  Intact  Cognition: WNL  Prognosis:  Good   DIAGNOSES:    ICD-10-CM   1. Attention deficit hyperactivity disorder (ADHD), predominantly inattentive type  F90.0     2. Situational anxiety  F41.8       Receiving Psychotherapy: No   RECOMMENDATIONS:  PDMP was reviewed.  Last Vyvanse  filled 08/01/2023.  Lomaira filled 11/25/2022.   I provided approximately 20 minutes of face to face time during this encounter, including time spent before and after the visit in records review, medical decision making, counseling pertinent to today's visit, and charting.   Overall she is doing well on the Vyvanse . Ok to increase the Vyvanse  to 50 mg by phone if she wants to try that before our next  visit.  Continue Vyvanse  40 mg, 1 q am. Continue vitamins.  Return in 6 months.    Verneita Cooks, PA-C

## 2023-08-19 ENCOUNTER — Encounter (INDEPENDENT_AMBULATORY_CARE_PROVIDER_SITE_OTHER): Payer: Self-pay

## 2023-09-08 ENCOUNTER — Ambulatory Visit: Admitting: Obstetrics and Gynecology

## 2023-09-23 ENCOUNTER — Telehealth: Payer: Self-pay | Admitting: *Deleted

## 2023-09-23 NOTE — Telephone Encounter (Signed)
 Patient left message on triage line, states she is due to refill Zepbound , has been on 2.5 mg dose, asking if she should be increasing to 5 mg?    Last OV 07/28/23 EMB scheduled for 10/19/23

## 2023-09-25 ENCOUNTER — Encounter: Payer: Self-pay | Admitting: Obstetrics and Gynecology

## 2023-09-26 DIAGNOSIS — R5383 Other fatigue: Secondary | ICD-10-CM | POA: Diagnosis not present

## 2023-09-26 DIAGNOSIS — E041 Nontoxic single thyroid nodule: Secondary | ICD-10-CM | POA: Diagnosis not present

## 2023-09-26 NOTE — Telephone Encounter (Signed)
 Dr. Nikki -please review 07/28/23 OV and advise on refill.   Patient is scheduled for EMB with Dr. Glennon on 10/19/23

## 2023-09-26 NOTE — Telephone Encounter (Signed)
 Call placed to patient, patient unable to talk at work.   MyChart message sent.   Routing to Dr. Glennon.   See open telephone encounter dated 09/23/23

## 2023-09-29 NOTE — Telephone Encounter (Signed)
 Dr. Glennon can you send in the rx for the patient & also let us  know when she needs to come back in for a weight check/consult.

## 2023-09-29 NOTE — Telephone Encounter (Signed)
 Does she need a new rx for this since she needs to increase to 5mg . If so please sent rx to her pharmacy

## 2023-09-30 ENCOUNTER — Other Ambulatory Visit: Payer: Self-pay | Admitting: Obstetrics and Gynecology

## 2023-09-30 MED ORDER — TIRZEPATIDE-WEIGHT MANAGEMENT 5 MG/0.5ML ~~LOC~~ SOLN: 5.0000 mg | SUBCUTANEOUS | 3 refills | Status: DC

## 2023-09-30 NOTE — Telephone Encounter (Signed)
 Weight documented in EPIC.   Routing FYI.   Encounter closed.

## 2023-09-30 NOTE — Telephone Encounter (Signed)
 Routing to [place] for PA.

## 2023-09-30 NOTE — Telephone Encounter (Signed)
 Left message to call [facility] Triage at [phone], option 4.

## 2023-10-05 NOTE — Telephone Encounter (Signed)
 Never received a PA for the Zepbound . After looking at the pt's med list it does look like like Dr. Glennon sent in Tirzepatide  5MG  injection to the pharmacy.   Does a PA still need to be completed on the Zepbound ?

## 2023-10-06 NOTE — Telephone Encounter (Signed)
 Follow-up with the pharmacy to confirm if PA needed. Looks like alternative request was sent to Dr. Glennon from pharmacy for Pen v/s vial

## 2023-10-10 NOTE — Telephone Encounter (Signed)
 Spoke to the pharmacist he states that he will fax over the PA again verified the fax # with him 2x

## 2023-10-13 ENCOUNTER — Encounter: Payer: Self-pay | Admitting: Obstetrics and Gynecology

## 2023-10-13 MED ORDER — ZEPBOUND 7.5 MG/0.5ML ~~LOC~~ SOLN: 7.5000 mg | SUBCUTANEOUS | 0 refills | Status: DC

## 2023-10-13 NOTE — Telephone Encounter (Signed)
 Per review of EPIC, Rx [facility]   [facility], was advised [facility]. No Rx on file. No PA on file.   Per review of EPIC, previous RX sent to [facility].   Pharmacy updated.  Routing to Dr. Glennon. Please review pended Rx.

## 2023-10-14 ENCOUNTER — Ambulatory Visit (INDEPENDENT_AMBULATORY_CARE_PROVIDER_SITE_OTHER): Admitting: Otolaryngology

## 2023-10-14 ENCOUNTER — Encounter (INDEPENDENT_AMBULATORY_CARE_PROVIDER_SITE_OTHER): Payer: Self-pay | Admitting: Otolaryngology

## 2023-10-14 VITALS — BP 113/73 | HR 97

## 2023-10-14 DIAGNOSIS — E041 Nontoxic single thyroid nodule: Secondary | ICD-10-CM | POA: Diagnosis not present

## 2023-10-14 NOTE — Progress Notes (Signed)
 ENT CONSULT:  Reason for Consult: thyroid  nodule    HPI: Discussed the use of AI scribe software for clinical note transcription with the patient, who gave verbal consent to proceed.  History of Present Illness Brittany Hart is a 43 year old female who presents for evaluation of thyroid  nodule and cysts. An abnormality was palpated in her neck during a routine examination, leading to an ultrasound that revealed a nodule and two cysts in the left thyroid . She was asymptomatic prior to these findings.  The ultrasound confirmed the presence of one nodule and two cysts in the left thyroid . The patient denied having compressive symptoms.   Records Reviewed:  GYN visit 07/28/23  Exam: Constitutional:      Appearance: Normal appearance.     Comments: Possible left thyroid  nodule or fullness     Past Medical History:  Diagnosis Date   ADD (attention deficit disorder)    Anxiety    Kidney stone    Ocular migraine    Renal calculus or stone    CURRENT   Vitamin D  deficiency 03/07/2020    Past Surgical History:  Procedure Laterality Date   ESOPHAGEAL DILATION     URETEROSCOPY  06/04/2011   Procedure: URETEROSCOPY;  Surgeon: Oneil JAYSON Rafter, MD;  Location: [facility];  Service: Urology;  Laterality: Left;  cystoscopy, Left ureteroscopy with lithotripsy holmium laser    Family History  Problem Relation Age of Onset   Spondylitis Mother    Bronchiolitis Mother    Depression Mother    Anxiety disorder Father    Depression Father    GER disease Father    Seizures Father    Breast cancer Maternal Grandmother        4s   Hypertension Maternal Grandmother    Depression Maternal Grandmother    Anxiety disorder Maternal Grandmother    Dementia Maternal Grandmother    Kidney cancer Maternal Grandfather    Breast cancer Paternal Grandmother        30s   CVA Paternal Grandfather    ADD / ADHD Son    Healthy Son     Social History:  reports that she has never  smoked. She has never used smokeless tobacco. She reports that she does not currently use alcohol. She reports that she does not use drugs.  Allergies: No Known Allergies  Medications: I have reviewed the patient's current medications.  The PMH, PSH, Medications, Allergies, and SH were reviewed and updated.  ROS: Constitutional: Negative for fever, weight loss and weight gain. Cardiovascular: Negative for chest pain and dyspnea on exertion. Respiratory: Is not experiencing shortness of breath at rest. Gastrointestinal: Negative for nausea and vomiting. Neurological: Negative for headaches. Psychiatric: The patient is not nervous/anxious  Blood pressure 113/73, pulse 97, SpO2 97%. There is no height or weight on file to calculate BMI.  PHYSICAL EXAM:  Exam: General: Well-developed, well-nourished Communication and Voice: Clear pitch and clarity Respiratory Respiratory effort: Equal inspiration and expiration without stridor Cardiovascular Peripheral Vascular: Warm extremities with equal color/perfusion Eyes: No nystagmus with equal extraocular motion bilaterally Neuro/Psych/Balance: Patient oriented to person, place, and time; Appropriate mood and affect; Gait is intact with no imbalance; Cranial nerves I-XII are intact Head and Face Inspection: Normocephalic and atraumatic without mass or lesion Palpation: Facial skeleton intact without bony stepoffs Salivary Glands: No mass or tenderness Facial Strength: Facial motility symmetric and full bilaterally ENT Pinna: External ear intact and fully developed External canal: Canal is patent with intact skin  Tympanic Membrane: Clear and mobile External Nose: No scar or anatomic deformity Internal Nose: Septum is straight. No polyp, or purulence. Mucosal edema and erythema present.  Bilateral inferior turbinate hypertrophy.  Lips, Teeth, and gums: Mucosa and teeth intact and viable TMJ: No pain to palpation with full mobility Oral  cavity/oropharynx: No erythema or exudate, no lesions present Neck Neck and Trachea: Midline trachea without mass or lesion Thyroid : No mass or nodularity Lymphatics: No lymphadenopathy  Studies Reviewed: Thyroid  U/S 08/02/23 IMPRESSION: 1. 1.6 cm left mid thyroid  nodule (nodule 1) meets criteria for 1 year follow-up ultrasound. 2. 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  Assessment/Plan: Encounter Diagnoses  Name Primary?   Thyroid  nodule Yes   Thyroid  cyst     Assessment and Plan Assessment & Plan Left thyroid  nodule and thyroid  cysts Ultrasound showed one nodule and two cysts in the left thyroid . Nodule not meeting biopsy criteria, recommended for repeat U/S. Cysts small, asymptomatic. - Repeat thyroid  ultrasound in one year to monitor growth. - Follow up with GYN or primary care for ultrasound of thyroid  in 12 months - Monitor for size changes that may require biopsy.   Thank you for allowing me to participate in the care of this patient. Please do not hesitate to contact me with any questions or concerns.   Elena Larry, MD Otolaryngology [facility] Phone: [phone] Fax: [phone]    10/14/2023, 10:51 AM

## 2023-10-18 MED ORDER — ZEPBOUND 5 MG/0.5ML ~~LOC~~ SOLN: 5.0000 mg | SUBCUTANEOUS | 0 refills | Status: DC

## 2023-10-19 ENCOUNTER — Other Ambulatory Visit (HOSPITAL_COMMUNITY)
Admission: RE | Admit: 2023-10-19 | Discharge: 2023-10-19 | Disposition: A | Discharge status: Home / Self Care | Source: Ambulatory Visit | Attending: Obstetrics and Gynecology | Admitting: Obstetrics and Gynecology

## 2023-10-19 ENCOUNTER — Ambulatory Visit (INDEPENDENT_AMBULATORY_CARE_PROVIDER_SITE_OTHER): Admitting: Obstetrics and Gynecology

## 2023-10-19 ENCOUNTER — Encounter: Payer: Self-pay | Admitting: Obstetrics and Gynecology

## 2023-10-19 VITALS — BP 128/80 | HR 68 | Ht 63.0 in | Wt 196.8 lb

## 2023-10-19 DIAGNOSIS — N920 Excessive and frequent menstruation with regular cycle: Secondary | ICD-10-CM

## 2023-10-19 DIAGNOSIS — R935 Abnormal findings on diagnostic imaging of other abdominal regions, including retroperitoneum: Secondary | ICD-10-CM | POA: Insufficient documentation

## 2023-10-19 DIAGNOSIS — N946 Dysmenorrhea, unspecified: Secondary | ICD-10-CM | POA: Insufficient documentation

## 2023-10-19 NOTE — Progress Notes (Signed)
 43 y.o. y.o. female here for EMB prior to surgery.  She is sure she wants to have the hysterectomy and reports her last cycle that her cramps woke her up and caused her significant pain.  Patient's last menstrual period was 10/01/2023 (exact date). Period Cycle (Days): 28 Period Duration (Days): 6 Period Pattern: Regular Menstrual Flow: Heavy Menstrual Control: Maxi pad, Tampon Menstrual Control Change Freq (Hours): 2-3 Dysmenorrhea: (!) Moderate Dysmenorrhea Symptoms: Cramping, Diarrhea, Headache   Periods are heavy and painful.  Tried several different ocp's in the past for the bleeding with no success.  Feels drained by them.  Has history of anemia  Bleeds 1-2 pads an hour. 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  They do not desire more children and she is having menopausal symptoms such as fatigue, weight gain, joint pain, doesn't feel like herself. Hard to focus or finish a task, even with vyvanz, memory fog She is planning on the [facility] in September  She has seen ENT for the thyroid  nodules and they will repeat the ultrasound in a year. No other intervention at this time.  Weight gain is bothersome. Has tried conclave, diet and exercise with no success.  She would like zep bound  now and is ready to increase to 5mg  and was on 2.5mg  weekly.  She was at 208 at the start.  She is going through [employer] [facility]. She is watching her intake and walking more. Having some reflux  MMG 2023 Colonoscopy: to begin at age 43 Dxa: none HRT: none No Gi/GU complaints  Body mass index is 34.86 kg/m. Blood pressure 128/80, pulse 68, height 5' 3 (1.6 m), weight 196 lb 12.8 oz (89.3 kg), last menstrual period 10/01/2023, SpO2 98%. Last visit 212lbs    Blood pressure 128/80, pulse 68, height 5' 3 (1.6 m), weight 196 lb 12.8 oz (89.3 kg), last menstrual period 10/01/2023, SpO2 98%.     Component Value Date/Time    DIAGPAP  06/29/2023 1605    - Negative for intraepithelial lesion or malignancy (NILM)   DIAGPAP  05/25/2021 1632    - Negative for intraepithelial lesion or malignancy (NILM)   DIAGPAP  10/13/2016 0000    NEGATIVE FOR INTRAEPITHELIAL LESIONS OR MALIGNANCY.   HPVHIGH Negative 05/25/2021 1632   ADEQPAP  06/29/2023 1605    Satisfactory for evaluation; transformation zone component PRESENT.   ADEQPAP  05/25/2021 1632    Satisfactory for evaluation; transformation zone component PRESENT.   ADEQPAP  10/13/2016 0000    Satisfactory for evaluation  endocervical/transformation zone component PRESENT.    GYN HISTORY:    Component Value Date/Time   DIAGPAP  06/29/2023 1605    - Negative for intraepithelial lesion or malignancy (NILM)   DIAGPAP  05/25/2021 1632    - Negative for intraepithelial lesion or malignancy (NILM)   DIAGPAP  10/13/2016 0000    NEGATIVE FOR INTRAEPITHELIAL LESIONS OR MALIGNANCY.   HPVHIGH Negative 05/25/2021 1632   ADEQPAP  06/29/2023 1605    Satisfactory for evaluation; transformation zone component PRESENT.   ADEQPAP  05/25/2021 1632    Satisfactory for evaluation; transformation zone component PRESENT.   ADEQPAP  10/13/2016 0000    Satisfactory for evaluation  endocervical/transformation zone component PRESENT.    OB History  Gravida Para Term Preterm AB Living  2 2 2   2   SAB IAB Ectopic Multiple Live Births  2    # Outcome Date GA Lbr Len/2nd Weight Sex Type Anes PTL Lv  2 Term      Vag-Spont   LIV  1 Term      Vag-Spont   LIV    Past Medical History:  Diagnosis Date   ADD (attention deficit disorder)    Anxiety    Kidney stone    Ocular migraine    Renal calculus or stone    CURRENT   Vitamin D  deficiency 03/07/2020    Past Surgical History:  Procedure Laterality Date   ESOPHAGEAL DILATION     URETEROSCOPY  06/04/2011   Procedure: URETEROSCOPY;  Surgeon: Oneil JAYSON Rafter, MD;  Location: [facility];  Service: Urology;   Laterality: Left;  cystoscopy, Left ureteroscopy with lithotripsy holmium laser    Current Outpatient Medications on File Prior to Visit  Medication Sig Dispense Refill   cyclobenzaprine (FLEXERIL) 5 MG tablet 1 tablet Orally three times a day as needed for 7 days     [START ON 10/29/2023] lisdexamfetamine (VYVANSE ) 40 MG capsule Take 1 capsule (40 mg total) by mouth every morning. 30 capsule 0   tirzepatide  (ZEPBOUND ) 5 MG/0.5ML injection vial Inject 5 mg into the skin once a week for 4 doses. 2 mL 0   vitamin B-12 (CYANOCOBALAMIN ) 500 MCG tablet TAKE 1 TABLET BY MOUTH EVERY DAY FOR 30 DAYS     VITAMIN D  PO Take 1 tablet by mouth daily.     No current facility-administered medications on file prior to visit.    Social History   Socioeconomic History   Marital status: Married    Spouse name: Micheal   Number of children: 2   Years of education: 14   Highest education level: Associate degree: occupational, Scientist, product/process development, or vocational program  Occupational History   Occupation: preschool    Comment: 43 yo  Tobacco Use   Smoking status: Never   Smokeless tobacco: Never  Vaping Use   Vaping status: Never Used  Substance and Sexual Activity   Alcohol use: Not Currently   Drug use: No   Sexual activity: Yes    Partners: Male    Birth control/protection: Other-see comments    Comment: Huband had vasectomy  Other Topics Concern   Not on file  Social History Narrative   Patient lives at home with her husband Systems analyst) and 2 sons   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   Grew up in [place]. Only child.  Both bio parents in home. Mom was homemaker.  Dad was Psychologist, occupational.    No religious beliefs.   No legal.    Social Drivers of Health   Financial Resource Strain: Low Risk  (05/01/2018)   Overall Financial Resource Strain (CARDIA)    Difficulty of Paying Living Expenses: Not hard at all  Food Insecurity: No  Food Insecurity (05/01/2018)   Hunger Vital Sign    Worried About Running Out of Food in the Last Year: Never true    Ran Out of Food in the Last Year: Never true  Transportation Needs: No Transportation Needs (05/01/2018)   PRAPARE - Administrator, Civil Service (Medical): No    Lack of Transportation (Non-Medical): No  Physical Activity: Inactive (05/01/2018)   Exercise Vital Sign    Days of Exercise per Week: 0 days    Minutes of Exercise  per Session: 0 min  Stress: Not on file  Social Connections: Unknown (06/05/2021)   Received from [facility]   Social Network    Social Network: Not on file  Intimate Partner Violence: Unknown (05/07/2021)   Received from [facility]   HITS    Physically Hurt: Not on file    Insult or Talk Down To: Not on file    Threaten Physical Harm: Not on file    Scream or Curse: Not on file    Family History  Problem Relation Age of Onset   Spondylitis Mother    Bronchiolitis Mother    Depression Mother    Anxiety disorder Father    Depression Father    GER disease Father    Seizures Father    Breast cancer Maternal Grandmother        75s   Hypertension Maternal Grandmother    Depression Maternal Grandmother    Anxiety disorder Maternal Grandmother    Dementia Maternal Grandmother    Kidney cancer Maternal Grandfather    Breast cancer Paternal Grandmother        30s   CVA Paternal Grandfather    ADD / ADHD Son    Healthy Son      No Known Allergies    Patient's last menstrual period was Patient's last menstrual period was 10/01/2023 (exact date)..            Review of Systems Alls systems reviewed and are negative.    last visit: Physical Exam Constitutional:      Appearance: Normal appearance.     Comments: Possible left thyroid  nodule or fullness  Genitourinary:     Vulva and urethral meatus normal.     No lesions in the vagina.     Right Labia: No rash, lesions or skin changes.    Left Labia: No lesions, skin  changes or rash.    No vaginal discharge or tenderness.     No vaginal prolapse present.    No vaginal atrophy present.     Right Adnexa: not tender, not palpable and no mass present.    Left Adnexa: not tender, not palpable and no mass present.    No cervical motion tenderness or discharge.     Uterus is not enlarged, tender or irregular.  Breasts:    Right: Normal.     Left: Normal.  HENT:     Head: Normocephalic.  Neck:     Thyroid : No thyroid  mass, thyromegaly or thyroid  tenderness.  Cardiovascular:     Rate and Rhythm: Normal rate and regular rhythm.     Heart sounds: Normal heart sounds, S1 normal and S2 normal.  Pulmonary:     Effort: Pulmonary effort is normal.     Breath sounds: Normal breath sounds and air entry.  Abdominal:     General: There is no distension.     Palpations: Abdomen is soft. There is no mass.     Tenderness: There is no abdominal tenderness. There is no guarding or rebound.  Musculoskeletal:        General: Normal range of motion.     Cervical back: Full passive range of motion without pain, normal range of motion and neck supple. No tenderness.     Right lower leg: No edema.     Left lower leg: No edema.  Neurological:     Mental Status: She is alert.  Skin:    General: Skin is warm.  Psychiatric:  Mood and Affect: Mood normal.        Behavior: Behavior normal.        Thought Content: Thought content normal.  Vitals and nursing note reviewed. Exam conducted with a chaperone present.   10.38cm uterus No myometrial masses  Normal ovaries Endometrial lining thickened at 14.70mm avascular    TIME OUT PERFORMED PROCEDURE: EMB Consent obtained for the procedure.  A bivalve speculum was placed in the vagina.  The cervix was grasped with a single tooth tenaculum.  Pipelle was inserted and rotated. Uterus sound to 9cm. Adequate specimen was obtained and sent to pathology.  All instruments were removed.  Patient tolerated the procedure  well.  To notify patient of the results.  A:      menorrhagia Dysmenorrhea Anemia Likely adenomyosis Thickened endometrial lining on PUS                P:      [facility] to be scheduled in September EMB completed today.  Counseled on the importance of the testing She is ready to schedule surgery. Message sent to surgery scheduling. 5mg  weekly sent to [facility] for preop  No follow-ups on file.  [name]

## 2023-10-21 ENCOUNTER — Ambulatory Visit: Payer: Self-pay | Admitting: Obstetrics and Gynecology

## 2023-10-21 LAB — SURGICAL PATHOLOGY

## 2023-12-06 ENCOUNTER — Other Ambulatory Visit: Payer: Self-pay

## 2023-12-06 ENCOUNTER — Telehealth: Payer: Self-pay | Admitting: Physician Assistant

## 2023-12-06 DIAGNOSIS — F900 Attention-deficit hyperactivity disorder, predominantly inattentive type: Secondary | ICD-10-CM

## 2023-12-06 MED ORDER — LISDEXAMFETAMINE DIMESYLATE 40 MG PO CAPS: 40.0000 mg | ORAL_CAPSULE | ORAL | 0 refills | Status: DC

## 2023-12-06 NOTE — Telephone Encounter (Signed)
 Pt lvm requesting Vyvanse  40 mg Rx to [facility] [address]   [address]

## 2023-12-06 NOTE — Telephone Encounter (Signed)
 Pended

## 2023-12-08 ENCOUNTER — Encounter: Payer: Self-pay | Admitting: Obstetrics and Gynecology

## 2023-12-08 ENCOUNTER — Ambulatory Visit (INDEPENDENT_AMBULATORY_CARE_PROVIDER_SITE_OTHER): Admitting: Obstetrics and Gynecology

## 2023-12-08 VITALS — BP 118/82 | HR 91 | Ht 62.25 in | Wt 189.0 lb

## 2023-12-08 DIAGNOSIS — Z01818 Encounter for other preprocedural examination: Secondary | ICD-10-CM

## 2023-12-08 DIAGNOSIS — N920 Excessive and frequent menstruation with regular cycle: Secondary | ICD-10-CM | POA: Diagnosis not present

## 2023-12-08 DIAGNOSIS — N946 Dysmenorrhea, unspecified: Secondary | ICD-10-CM

## 2023-12-08 MED ORDER — IBUPROFEN 800 MG PO TABS
800.0000 mg | ORAL_TABLET | Freq: Three times a day (TID) | ORAL | 1 refills | Status: AC | PRN
Start: 2023-12-08 — End: ?

## 2023-12-08 MED ORDER — METOCLOPRAMIDE HCL 10 MG PO TABS: 10.0000 mg | ORAL_TABLET | Freq: Three times a day (TID) | ORAL | 0 refills | Status: AC | PRN

## 2023-12-08 MED ORDER — OXYCODONE HCL 5 MG PO TABS: 5.0000 mg | ORAL_TABLET | ORAL | 0 refills | Status: AC | PRN

## 2023-12-08 NOTE — H&P (View-Only) (Signed)
 43 y.o. y.o. female here for PREOP for RLH, bilateral salpingectomy, cystoscopy She is sure she wants to have the hysterectomy and reports her last cycle that her cramps woke her up and caused her significant pain. She had a 10 day period last month and has severe back pain 2 weeks before her cycle.  Patient's last menstrual period was 11/28/2023 (exact date). Period Cycle (Days): 28 Period Duration (Days): 6 Period Pattern: Regular Menstrual Flow: Heavy Menstrual Control: Maxi pad, Tampon Dysmenorrhea: (!) Moderate Dysmenorrhea Symptoms: Cramping   Periods are heavy and painful.  Tried several different ocp's in the past for the bleeding with no success.  Feels drained by them.  Has history of anemia  Bleeds 1-2 pads an hour. 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  They do not desire more children and she is having menopausal symptoms such as fatigue, weight gain, joint pain, doesn't feel like herself. Hard to focus or finish a task, even with vyvanz, memory fog She is planning on the [facility] in December  She has seen ENT for the thyroid  nodules and they will repeat the ultrasound in a year. No other intervention at this time.  Weight gain is bothersome. Has tried conclave, diet and exercise with no success.  She is currently on the 7.5 weekly of the zepbound  Having some reflux  MMG 07/12/23 Colonoscopy: to begin at age 36 Dxa: none HRT: none No Gi/GU complaints Does feel like she is getting some perimenopausal symptoms  Body mass index is 34.29 kg/m. Blood pressure 118/82, pulse 91, height 5' 2.25 (1.581 m), weight 189 lb (85.7 kg), last menstrual period 11/28/2023, SpO2 97%. Last visit 212lbs    Blood pressure 118/82, pulse 91, height 5' 2.25 (1.581 m), weight 189 lb (85.7 kg), last menstrual period 11/28/2023, SpO2 97%.    GYN HISTORY:    Component Value Date/Time   DIAGPAP  06/29/2023 1605     - Negative for intraepithelial lesion or malignancy (NILM)   DIAGPAP  05/25/2021 1632    - Negative for intraepithelial lesion or malignancy (NILM)   DIAGPAP  10/13/2016 0000    NEGATIVE FOR INTRAEPITHELIAL LESIONS OR MALIGNANCY.   HPVHIGH Negative 05/25/2021 1632   ADEQPAP  06/29/2023 1605    Satisfactory for evaluation; transformation zone component PRESENT.   ADEQPAP  05/25/2021 1632    Satisfactory for evaluation; transformation zone component PRESENT.   ADEQPAP  10/13/2016 0000    Satisfactory for evaluation  endocervical/transformation zone component PRESENT.    OB History  Gravida Para Term Preterm AB Living  2 2 2   2   SAB IAB Ectopic Multiple Live Births      2    # Outcome Date GA Lbr Len/2nd Weight Sex Type Anes PTL Lv  2 Term      Vag-Spont   LIV  1 Term      Vag-Spont   LIV    Past Medical History:  Diagnosis Date   ADD (attention deficit disorder)    Anxiety    Kidney stone    Ocular migraine    Renal calculus or stone    CURRENT   Vitamin D  deficiency 03/07/2020    Past Surgical History:  Procedure Laterality Date   ESOPHAGEAL DILATION     URETEROSCOPY  06/04/2011   Procedure: URETEROSCOPY;  Surgeon: Oneil JAYSON Rafter, MD;  Location: [facility];  Service: Urology;  Laterality: Left;  cystoscopy, Left ureteroscopy with lithotripsy holmium laser    Current Outpatient Medications on File Prior to Visit  Medication Sig Dispense Refill   cyclobenzaprine (FLEXERIL) 5 MG tablet 1 tablet Orally three times a day as needed for 7 days     lisdexamfetamine (VYVANSE ) 40 MG capsule Take 1 capsule (40 mg total) by mouth every morning. 30 capsule 0   [START ON 01/04/2024] lisdexamfetamine (VYVANSE ) 40 MG capsule Take 1 capsule (40 mg total) by mouth every morning. 30 capsule 0   [START ON 02/01/2024] lisdexamfetamine (VYVANSE ) 40 MG capsule Take 1 capsule (40 mg total) by mouth every morning. 30 capsule 0   tirzepatide  (ZEPBOUND ) 7.5 MG/0.5ML Pen Inject 7.5  mg into the skin once a week.     vitamin B-12 (CYANOCOBALAMIN ) 500 MCG tablet TAKE 1 TABLET BY MOUTH EVERY DAY FOR 30 DAYS     VITAMIN D  PO Take 1 tablet by mouth daily.     No current facility-administered medications on file prior to visit.    Social History   Socioeconomic History   Marital status: Married    Spouse name: Micheal   Number of children: 2   Years of education: 14   Highest education level: Associate degree: occupational, scientist, product/process development, or vocational program  Occupational History   Occupation: preschool    Comment: 43 yo  Tobacco Use   Smoking status: Never   Smokeless tobacco: Never  Vaping Use   Vaping status: Never Used  Substance and Sexual Activity   Alcohol use: Not Currently   Drug use: No   Sexual activity: Yes    Partners: Male    Birth control/protection: Other-see comments    Comment: Huband had vasectomy  Other Topics Concern   Not on file  Social History Narrative   Patient lives at home with her husband Systems Analyst) and 2 sons   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   Grew up in [place]. Only child.  Both bio parents in home. Mom was homemaker.  Dad was psychologist, occupational.    No religious beliefs.   No legal.    Social Drivers of Health   Financial Resource Strain: Low Risk  (05/01/2018)   Overall Financial Resource Strain (CARDIA)    Difficulty of Paying Living Expenses: Not hard at all  Food Insecurity: No Food Insecurity (05/01/2018)   Hunger Vital Sign    Worried About Running Out of Food in the Last Year: Never true    Ran Out of Food in the Last Year: Never true  Transportation Needs: No Transportation Needs (05/01/2018)   PRAPARE - Administrator, Civil Service (Medical): No    Lack of Transportation (Non-Medical): No  Physical Activity: Inactive (05/01/2018)   Exercise Vital Sign    Days of Exercise per Week: 0 days    Minutes of Exercise per Session: 0  min  Stress: Not on file  Social Connections: Unknown (06/05/2021)   Received from [facility]   Social Network    Social Network: Not on file  Intimate Partner Violence: Unknown (05/07/2021)   Received from [facility]   HITS    Physically Hurt: Not on file    Insult or Talk Down To: Not on file    Threaten Physical Harm: Not on file    Scream or Curse: Not on file    Family History  Problem Relation Age of Onset   Spondylitis Mother    Bronchiolitis Mother    Depression Mother    Anxiety disorder Father    Depression Father    GER disease Father    Seizures Father    Breast cancer Maternal Grandmother        64s   Hypertension Maternal Grandmother    Depression Maternal Grandmother    Anxiety disorder Maternal Grandmother    Dementia Maternal Grandmother    Kidney cancer Maternal Grandfather    Breast cancer Paternal Grandmother        30s   CVA Paternal Grandfather    ADD / ADHD Son    Healthy Son      No Known Allergies    Patient's last menstrual period was Patient's last menstrual period was 11/28/2023 (exact date)..            Review of Systems Alls systems reviewed and are negative.    last visit: Physical Exam Constitutional:      Appearance: Normal appearance.     Comments: Possible left thyroid  nodule or fullness  Genitourinary:     Vulva and urethral meatus normal.     No lesions in the vagina.     Right Labia: No rash, lesions or skin changes.    Left Labia: No lesions, skin changes or rash.    No vaginal discharge or tenderness.     No vaginal prolapse present.    No vaginal atrophy present.     Right Adnexa: not tender, not palpable and no mass present.    Left Adnexa: not tender, not palpable and no mass present.    No cervical motion tenderness or discharge.     Uterus is not enlarged, tender or irregular.  Breasts:    Right: Normal.     Left: Normal.  HENT:     Head: Normocephalic.  Neck:     Thyroid : No thyroid  mass,  thyromegaly or thyroid  tenderness.  Cardiovascular:     Rate and Rhythm: Normal rate and regular rhythm.     Heart sounds: Normal heart sounds, S1 normal and S2 normal.  Pulmonary:     Effort: Pulmonary effort is normal.     Breath sounds: Normal breath sounds and air entry.  Abdominal:     General: There is no distension.     Palpations: Abdomen is soft. There is no mass.     Tenderness: There is no abdominal tenderness. There is no guarding or rebound.  Musculoskeletal:        General: Normal range of motion.     Cervical back: Full passive range of motion without pain, normal range of motion and neck supple. No tenderness.     Right lower leg: No edema.     Left lower leg: No edema.  Neurological:     Mental Status: She is alert.  Skin:    General: Skin is warm.  Psychiatric:        Mood and Affect: Mood normal.        Behavior: Behavior normal.        Thought Content: Thought content normal.  Vitals and nursing note reviewed. Exam conducted with a chaperone present.   10.38cm uterus No myometrial masses  Normal ovaries Endometrial lining thickened at 14.51mm avascular     & Units (hover) 1 mo ago  SURGICAL PATHOLOGY SURGICAL PATHOLOGY CASE: [phone] PATIENT: Brittany Hart Surgical Pathology Report     Clinical History: dysmenorrhea, likely adenomyosis, DUB,  menorrhagia, pre-op for RLH (cf)     FINAL MICROSCOPIC DIAGNOSIS:  A. ENDOMETRIUM, BIOPSY: - Secretory endometrium.  GROSS DESCRIPTION:  Received in formalin are tan, hemorrhagic soft tissue fragments that are entirely submitted. Volume: 2.2 x 1.9 x 0.3 cm. (1 B)    A:      menorrhagia Dysmenorrhea Anemia Likely adenomyosis Thickened endometrial lining on PUS                P:   Preop H&P Menorrhagia Dysmenorrhea  The risk, benefits, alternatives of the procedure were discussed with patient including but not limited to the risk for bleeding, infection, injury to surrounding  structures such as the bowel, vessels, bladder, and ureters were reviewed.  The risk for blood products and risk for an additional procedure, and risk for laparotomy in an extreme event was discussed.  The risk for blood clots are also discussed.  Postoperative care instructions were discussed and reviewed with patient.  Postoperative medications were sent to her pharmacy and patient was instructed to pick up prior to surgery.  She will have a driver to take her there and take her home and she agrees that that person will stay with her overnight. Post care instructions were also put in the wrap-up section of this note for patient, as a reminder of care instructions. All questions were answered and patient agrees and would like to proceed with the procedure.  30 minutes spent on reviewing records, imaging,  and one on one patient time and counseling patient and documentation Dr. Glennon  No follow-ups on file.  Brittany Hart

## 2023-12-08 NOTE — Patient Instructions (Addendum)
 Robotic Laparoscopic Hysterectomy, Care After  The following information offers guidance on how to care for yourself after your procedure. Your health care provider may also give you more specific instructions. If you have problems or questions, contact your health care provider. What can I expect after the procedure? After the procedure, it is common to have: Pain, bruising, and numbness around your incisions. Tiredness (fatigue).  Abdominal bloating Poor appetite. Chest discomfort that radiates to your shoulder from the carbon dioxide gas for a few days after  Vaginal discharge or spotting. You will need to use a sanitary pad after this procedure.  HEAVY BLEEDING LIKE A PERIOD IS NOT NORMAL.  PLEASE CALL YOUR PROVIDER IF SOAKING A PAD or have copious discharge and or pain.  Feelings of sadness or other emotions.  If your ovaries were also removed, it is also common to have symptoms of menopause, such as hot flashes, night sweats, and lack of sleep (insomnia).  Ovaries should stay in if at all possible until at least the age of 68. Follow these instructions at home: Medicines Take over-the-counter and prescription medicines only as told by your health care provider. Ask your health care provider if the medicine prescribed to you: Requires you to avoid driving or using machinery. You cannot drive for 24 hours after anesthesia Can cause constipation. You may need to take these actions to prevent or treat constipation: Drink enough fluid to keep your urine pale yellow. Take over-the-counter or prescription medicines. Eat foods that are high in fiber, such as beans, whole grains, and fresh fruits and vegetables. Limit foods that are high in fat and processed sugars, such as fried or sweet foods.  Also, avoid spicy foods.  NAUSEA IS COMMON THE FIRST NIGHT OF SURGERY.  IF IT LASTS BEYOND 24 HOURS, CALL YOUR PROVIDER.  NAUSEA MEDICATION WAS GIVEN AT YOUR PREOP APPOINTMENT THAT YOU CAN TAKE  AFTER SURGERY. Incision care  Follow instructions from your health care provider about how to take care of your incisions. Make sure you: LEAVE INCISION OPEN AND DRY-NO BANDAGES Leave stitches (sutures), skin glue, or adhesive strips in place UNTIL 2 WEEKS THEN REMOVE IN THE SHOWER.  If adhesive strip edges start to loosen and curl up, you may trim the loose edges. Check your incision areas every day for signs of infection. Check for: More redness, swelling, or pain. Fluid or blood. Warmth. Pus or a bad smell. Activity  Rest as told by your health care provider. Avoid sitting for a long time without moving. Get up to take short walks every 1-2 hours. This is important to improve blood flow and breathing. Ask for help if you feel weak or unsteady.  If you are sore or tired, rest. Return to your normal activities as told by your health care provider. Ask your health care provider what activities are safe for you. Do not lift, push or pull anything that is heavier than 13 lb (4.5 kg), or the limit that you are told, for 6 WEEKS after surgery or until your health care provider says that it is safe. If you were given a sedative during the procedure, it can affect you for several hours. Do not drive or operate machinery until your health care provider says that it is safe. Lifestyle Do not use any products that contain nicotine or tobacco. These products include cigarettes, chewing tobacco, and vaping devices, such as e-cigarettes. These can delay healing after surgery. If you need help quitting, ask your health care provider.  Do not drink alcohol until your health care provider approves. Take a daily multivitamin and keep a high protein diet for wound healing  DO NOT HAVE INTERCOURSE UNTIL YOU ARE INSTRUCTED THAT IT IS SAFE TO DO SO  Post operative appointments need to be scheduled at 2, 6 and 10 weeks.  You can come anytime before these with any concerns.   Discussed and reviewed with patient  risks with early intercourse or use of any foreign objects (externally or internally) can increase your risk including but not limited to the risk of vaginal cuff separation and or infection, risks for bowel involvement, risk for emergent surgery, and hospital admission with need for antibiotics.  Discussed in cases with cuff separation and bowel involvement there may be the need for colostomy placement as well.  In no situation should she have intercourse unless cleared to do so.  This can be anywhere from 10 weeks or longer after surgery.  General instructions YOU MAY TAKE SHOWERS ONLY FOR 2 WEEKS AFTER SURGERY, THEN YOU MAY USE TUBS AND HOT TUBS OR SWIM AFTER THAT Do not douche, use tampons, or have sex for at least 10 weeks, or possibly longer. You will need to have an exam done in the office to be cleared to have intercourse. If you struggle with physical or emotional changes after your procedure, speak with your health care provider or a therapist.  IF YOU HAVE BURNING WITH URINATION, PLEASE CALL YOUR DOCTOR. BLADDER INFECTIONS MAY OCCUR AFTER SURGERY Try to have someone at home with you for the first week to help with your daily chores.  Most patients are driving by the end of the first week after the robotic hysterectomy. Wear compression stockings as told by your health care provider. These stockings help to prevent blood clots and reduce swelling in your legs. Keep all follow-up visits. This is important. Contact a health care provider if: You have any of these signs of infection: Chills or a fever 155f OR GREATER. More redness, swelling, or pain around an incision. Fluid or blood coming from an incision. Warmth coming from an incision. Pus or a bad smell coming from an incision. Burning with urination. Urinary frequency or cramping.   IF YOU HAVE THESE SYMPTOMS, PLEASE CALL THE OFFICE TO COME EVALUATE FOR A BLADDER INFECTION AT [phone] An incision opens. You feel dizzy or  light-headed. You have pain or bleeding when you urinate, or you are unable to urinate. You have abnormal vaginal discharge. You have pain that does not get better with medicine. Get help right away if: You have a fever and your symptoms suddenly get worse. You have severe abdominal pain. Heavy vaginal bleeding, like a period You have chest pain or shortness of breath. You may have chest pain and shortness of breath from the CO2 gas for a few days after surgery.  This is very common.  Walking, Gas-X and motrin will usually help relieve this discomfort You faint. You have pain, swelling, or redness in your leg.  These symptoms may represent a serious problem that is an emergency. Do not wait to see if the symptoms will go away. Get medical help right away. Call your local emergency services (911 in the [place] Do not drive yourself to the hospital. Summary  CONSTIPATION MEDICATION AFTER SURGERY: COLACE, MOM, MIRALAX, GAS X are all helpful to have on hand, if needed.  FILL ALL POSTOP MEDICATION BEFORE SURGERY    [email or web address] is a nice blog site for women preparing for  the robotic hysterectomy   Perimenopause/Menopause suggestions   You should be getting 1,200 mg of calcium a day between your diet and supplements and at least 3000 IU a day of Vit D. You should exercise regularly with weight bearing exercises. I would recommend a bone density q 2 years in 50's.  We loose 5% per year in this time period of our bone density, due to the loss of estrogen. Magnesium at bedtime for our sleep and bones (follow recommended dose on bottle)  Please read The New Menopause my Dr. Ronal Stagger Haver  Try to avoid caffeine and alcohol in this period, as they can make symptoms worse  Continue annual mammograms, unless told sooner  Please reach out with any questions Brittany Hart Brittany Hart

## 2023-12-08 NOTE — Progress Notes (Signed)
 43 y.o. y.o. female here for PREOP for RLH, bilateral salpingectomy, cystoscopy She is sure she wants to have the hysterectomy and reports her last cycle that her cramps woke her up and caused her significant pain. She had a 10 day period last month and has severe back pain 2 weeks before her cycle.  Patient's last menstrual period was 11/28/2023 (exact date). Period Cycle (Days): 28 Period Duration (Days): 6 Period Pattern: Regular Menstrual Flow: Heavy Menstrual Control: Maxi pad, Tampon Dysmenorrhea: (!) Moderate Dysmenorrhea Symptoms: Cramping   Periods are heavy and painful.  Tried several different ocp's in the past for the bleeding with no success.  Feels drained by them.  Has history of anemia  Bleeds 1-2 pads an hour. 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  They do not desire more children and she is having menopausal symptoms such as fatigue, weight gain, joint pain, doesn't feel like herself. Hard to focus or finish a task, even with vyvanz, memory fog She is planning on the [facility] in December  She has seen ENT for the thyroid  nodules and they will repeat the ultrasound in a year. No other intervention at this time.  Weight gain is bothersome. Has tried conclave, diet and exercise with no success.  She is currently on the 7.5 weekly of the zepbound  Having some reflux  MMG 07/12/23 Colonoscopy: to begin at age 36 Dxa: none HRT: none No Gi/GU complaints Does feel like she is getting some perimenopausal symptoms  Body mass index is 34.29 kg/m. Blood pressure 118/82, pulse 91, height 5' 2.25 (1.581 m), weight 189 lb (85.7 kg), last menstrual period 11/28/2023, SpO2 97%. Last visit 212lbs    Blood pressure 118/82, pulse 91, height 5' 2.25 (1.581 m), weight 189 lb (85.7 kg), last menstrual period 11/28/2023, SpO2 97%.    GYN HISTORY:    Component Value Date/Time   DIAGPAP  06/29/2023 1605     - Negative for intraepithelial lesion or malignancy (NILM)   DIAGPAP  05/25/2021 1632    - Negative for intraepithelial lesion or malignancy (NILM)   DIAGPAP  10/13/2016 0000    NEGATIVE FOR INTRAEPITHELIAL LESIONS OR MALIGNANCY.   HPVHIGH Negative 05/25/2021 1632   ADEQPAP  06/29/2023 1605    Satisfactory for evaluation; transformation zone component PRESENT.   ADEQPAP  05/25/2021 1632    Satisfactory for evaluation; transformation zone component PRESENT.   ADEQPAP  10/13/2016 0000    Satisfactory for evaluation  endocervical/transformation zone component PRESENT.    OB History  Gravida Para Term Preterm AB Living  2 2 2   2   SAB IAB Ectopic Multiple Live Births      2    # Outcome Date GA Lbr Len/2nd Weight Sex Type Anes PTL Lv  2 Term      Vag-Spont   LIV  1 Term      Vag-Spont   LIV    Past Medical History:  Diagnosis Date   ADD (attention deficit disorder)    Anxiety    Kidney stone    Ocular migraine    Renal calculus or stone    CURRENT   Vitamin D  deficiency 03/07/2020    Past Surgical History:  Procedure Laterality Date   ESOPHAGEAL DILATION     URETEROSCOPY  06/04/2011   Procedure: URETEROSCOPY;  Surgeon: Oneil JAYSON Rafter, MD;  Location: [facility];  Service: Urology;  Laterality: Left;  cystoscopy, Left ureteroscopy with lithotripsy holmium laser    Current Outpatient Medications on File Prior to Visit  Medication Sig Dispense Refill   cyclobenzaprine (FLEXERIL) 5 MG tablet 1 tablet Orally three times a day as needed for 7 days     lisdexamfetamine (VYVANSE ) 40 MG capsule Take 1 capsule (40 mg total) by mouth every morning. 30 capsule 0   [START ON 01/04/2024] lisdexamfetamine (VYVANSE ) 40 MG capsule Take 1 capsule (40 mg total) by mouth every morning. 30 capsule 0   [START ON 02/01/2024] lisdexamfetamine (VYVANSE ) 40 MG capsule Take 1 capsule (40 mg total) by mouth every morning. 30 capsule 0   tirzepatide  (ZEPBOUND ) 7.5 MG/0.5ML Pen Inject 7.5  mg into the skin once a week.     vitamin B-12 (CYANOCOBALAMIN ) 500 MCG tablet TAKE 1 TABLET BY MOUTH EVERY DAY FOR 30 DAYS     VITAMIN D  PO Take 1 tablet by mouth daily.     No current facility-administered medications on file prior to visit.    Social History   Socioeconomic History   Marital status: Married    Spouse name: Micheal   Number of children: 2   Years of education: 14   Highest education level: Associate degree: occupational, scientist, product/process development, or vocational program  Occupational History   Occupation: preschool    Comment: 43 yo  Tobacco Use   Smoking status: Never   Smokeless tobacco: Never  Vaping Use   Vaping status: Never Used  Substance and Sexual Activity   Alcohol use: Not Currently   Drug use: No   Sexual activity: Yes    Partners: Male    Birth control/protection: Other-see comments    Comment: Huband had vasectomy  Other Topics Concern   Not on file  Social History Narrative   Patient lives at home with her husband Systems Analyst) and 2 sons   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   Grew up in [place]. Only child.  Both bio parents in home. Mom was homemaker.  Dad was psychologist, occupational.    No religious beliefs.   No legal.    Social Drivers of Health   Financial Resource Strain: Low Risk  (05/01/2018)   Overall Financial Resource Strain (CARDIA)    Difficulty of Paying Living Expenses: Not hard at all  Food Insecurity: No Food Insecurity (05/01/2018)   Hunger Vital Sign    Worried About Running Out of Food in the Last Year: Never true    Ran Out of Food in the Last Year: Never true  Transportation Needs: No Transportation Needs (05/01/2018)   PRAPARE - Administrator, Civil Service (Medical): No    Lack of Transportation (Non-Medical): No  Physical Activity: Inactive (05/01/2018)   Exercise Vital Sign    Days of Exercise per Week: 0 days    Minutes of Exercise per Session: 0  min  Stress: Not on file  Social Connections: Unknown (06/05/2021)   Received from [facility]   Social Network    Social Network: Not on file  Intimate Partner Violence: Unknown (05/07/2021)   Received from [facility]   HITS    Physically Hurt: Not on file    Insult or Talk Down To: Not on file    Threaten Physical Harm: Not on file    Scream or Curse: Not on file    Family History  Problem Relation Age of Onset   Spondylitis Mother    Bronchiolitis Mother    Depression Mother    Anxiety disorder Father    Depression Father    GER disease Father    Seizures Father    Breast cancer Maternal Grandmother        64s   Hypertension Maternal Grandmother    Depression Maternal Grandmother    Anxiety disorder Maternal Grandmother    Dementia Maternal Grandmother    Kidney cancer Maternal Grandfather    Breast cancer Paternal Grandmother        30s   CVA Paternal Grandfather    ADD / ADHD Son    Healthy Son      No Known Allergies    Patient's last menstrual period was Patient's last menstrual period was 11/28/2023 (exact date)..            Review of Systems Alls systems reviewed and are negative.    last visit: Physical Exam Constitutional:      Appearance: Normal appearance.     Comments: Possible left thyroid  nodule or fullness  Genitourinary:     Vulva and urethral meatus normal.     No lesions in the vagina.     Right Labia: No rash, lesions or skin changes.    Left Labia: No lesions, skin changes or rash.    No vaginal discharge or tenderness.     No vaginal prolapse present.    No vaginal atrophy present.     Right Adnexa: not tender, not palpable and no mass present.    Left Adnexa: not tender, not palpable and no mass present.    No cervical motion tenderness or discharge.     Uterus is not enlarged, tender or irregular.  Breasts:    Right: Normal.     Left: Normal.  HENT:     Head: Normocephalic.  Neck:     Thyroid : No thyroid  mass,  thyromegaly or thyroid  tenderness.  Cardiovascular:     Rate and Rhythm: Normal rate and regular rhythm.     Heart sounds: Normal heart sounds, S1 normal and S2 normal.  Pulmonary:     Effort: Pulmonary effort is normal.     Breath sounds: Normal breath sounds and air entry.  Abdominal:     General: There is no distension.     Palpations: Abdomen is soft. There is no mass.     Tenderness: There is no abdominal tenderness. There is no guarding or rebound.  Musculoskeletal:        General: Normal range of motion.     Cervical back: Full passive range of motion without pain, normal range of motion and neck supple. No tenderness.     Right lower leg: No edema.     Left lower leg: No edema.  Neurological:     Mental Status: She is alert.  Skin:    General: Skin is warm.  Psychiatric:        Mood and Affect: Mood normal.        Behavior: Behavior normal.        Thought Content: Thought content normal.  Vitals and nursing note reviewed. Exam conducted with a chaperone present.   10.38cm uterus No myometrial masses  Normal ovaries Endometrial lining thickened at 14.51mm avascular     & Units (hover) 1 mo ago  SURGICAL PATHOLOGY SURGICAL PATHOLOGY CASE: [id number] PATIENT: Brittany Hart Surgical Pathology Report     Clinical History: dysmenorrhea, likely adenomyosis, DUB,  menorrhagia, pre-op for RLH (cf)     FINAL MICROSCOPIC DIAGNOSIS:  A. ENDOMETRIUM, BIOPSY: - Secretory endometrium.  GROSS DESCRIPTION:  Received in formalin are tan, hemorrhagic soft tissue fragments that are entirely submitted. Volume: 2.2 x 1.9 x 0.3 cm. (1 B)    A:      menorrhagia Dysmenorrhea Anemia Likely adenomyosis Thickened endometrial lining on PUS                P:   Preop H&P Menorrhagia Dysmenorrhea  The risk, benefits, alternatives of the procedure were discussed with patient including but not limited to the risk for bleeding, infection, injury to surrounding  structures such as the bowel, vessels, bladder, and ureters were reviewed.  The risk for blood products and risk for an additional procedure, and risk for laparotomy in an extreme event was discussed.  The risk for blood clots are also discussed.  Postoperative care instructions were discussed and reviewed with patient.  Postoperative medications were sent to her pharmacy and patient was instructed to pick up prior to surgery.  She will have a driver to take her there and take her home and she agrees that that person will stay with her overnight. Post care instructions were also put in the wrap-up section of this note for patient, as a reminder of care instructions. All questions were answered and patient agrees and would like to proceed with the procedure.  30 minutes spent on reviewing records, imaging,  and one on one patient time and counseling patient and documentation Dr. Glennon  No follow-ups on file.  Almarie MARLA Glennon

## 2023-12-14 ENCOUNTER — Encounter: Payer: Self-pay | Admitting: Obstetrics and Gynecology

## 2023-12-14 ENCOUNTER — Encounter: Payer: Self-pay | Admitting: *Deleted

## 2023-12-15 ENCOUNTER — Other Ambulatory Visit: Payer: Self-pay | Admitting: Obstetrics and Gynecology

## 2023-12-15 MED ORDER — ZEPBOUND 7.5 MG/0.5ML ~~LOC~~ SOLN: 7.5000 mg | SUBCUTANEOUS | 0 refills | Status: DC

## 2023-12-15 NOTE — Telephone Encounter (Signed)
 Dr. Glennon -please review.  Rx for zepbound  7.5 mg was sent to [facility] pharmacy on 10/13/23 and discontinued by you on 10/18/23.  Zepbound  5 mg was sent to [facility] on 10/18/23.   Patient is requesting Rx for Zepbound  7.5 mg.    Last AEX 06/29/23  Patient is scheduled for RLH,BS, Cysto on 01/04/24

## 2023-12-16 DIAGNOSIS — H6991 Unspecified Eustachian tube disorder, right ear: Secondary | ICD-10-CM | POA: Diagnosis not present

## 2023-12-19 ENCOUNTER — Other Ambulatory Visit: Payer: Self-pay | Admitting: Obstetrics and Gynecology

## 2023-12-19 MED ORDER — ZEPBOUND 7.5 MG/0.5ML ~~LOC~~ SOLN: 7.5000 mg | SUBCUTANEOUS | 2 refills | Status: DC

## 2023-12-20 DIAGNOSIS — R400 Somnolence: Secondary | ICD-10-CM | POA: Diagnosis not present

## 2023-12-20 DIAGNOSIS — L659 Nonscarring hair loss, unspecified: Secondary | ICD-10-CM | POA: Diagnosis not present

## 2023-12-21 ENCOUNTER — Encounter (HOSPITAL_COMMUNITY): Payer: Self-pay | Admitting: Obstetrics and Gynecology

## 2023-12-21 DIAGNOSIS — Z0289 Encounter for other administrative examinations: Secondary | ICD-10-CM

## 2023-12-23 ENCOUNTER — Encounter (HOSPITAL_COMMUNITY): Payer: Self-pay | Admitting: Obstetrics and Gynecology

## 2023-12-23 NOTE — Pre-Procedure Instructions (Signed)
 Surgical Instructions  Your procedure is scheduled on :   Wednesday,  01-04-2024 Report to [facility] Main Entrance A at 6:30 AM, then check in the Admitting office. Any questions or running late day of surgery :  call [phone]  Questions prior to your surgery day:  call [phone], Monday -- Friday 8am - 4pm. If you experience any cold or flu symptoms such as cough, fever, chills, shortness of breath, etc. between now and you scheduled surgery, please notify your surgeon office.   Remember: Do Not eat any food and Do Not drink any liquids after midnight the night before surgery.   This includes No water,  candy,  gum, and mints.  Take these medicines the morning of surgery with A SIPS OF WATER:   Omeprazole (prilosec)  May take these medicines IF NEEDED:    NONE   One week prior to surgery, STOP taking any Aspirin (unless otherwise instructed by your surgeon) Aleve, Naproxen, ibuprofen , Motrin , Advil , Goody's, BC's, all herbal medications/ supplements, fish oil , and non-prescription vitamins.  Do NOT Smoke (tobacco/ vaping) and Do Not drink alcohol for 24 hours prior to your procedure.  For those patients that use a CPAP.  Please bring your CPAP/ mask/ tubing with them day of surgery . Anesthesia may ask recovery room nurse to use and if you stay the night you be asked to use it.  You will be asked to removed any contacts, glasses, piercing's, hearing aid's, dentures/ partials prior to surgery.  Please bring cases/ container/ solution/ etc., for them day of surgery.   Patients discharged the day of surgery will NOT be allowed to drive home.  You must have responsible driver and caregiver to stay at home with you the next 24 hours.  SURGICAL WAITING ROOM VISITATION Patients may have no more than 2 support people in the waiting area - if more than 2 , these visitors may rotate.  Pre-op nurse will coordinate an appropriate time for 1 Adult support person, who may not rotate, to  accompany patient in pre-op.  Aware some patients may have certain circumstances, speak to pre-op nurse day of surgery.  Children under the age 81 must have an adult with them who is not the patient and must remain in the main waiting area with an adult.  If the patient needs to stay at the hospital during part of their recovery, the visitor guidelines for inpatient rooms apply.  Please refer to the [facility] website for the visitor guidelines for any additional information.  If you received a COVID test during your pre-op visit it is requested that you wear a mask when out in public, stay away from anyone that may not be feeling well and notify your surgeon if you develop symptoms.  If you have been in contact with anyone that has tested positive in the past 10 days notify your surgeon.     [facility] - Preparing for Surgery  Before surgery, you can play an important role. Because skin is not sterile, it needs to be as free of germs as possible. You can reduce the number of germs on your skin by washing with CHG (chlorhexidine gluconate) soap before surgery. CHG is an antiseptic cleaner which kills germs and bonds with the skin to continue killing germs even after washing. Oral hygiene is also important in reducing the risk of infection. Remember to brush your teeth with your regular toothpaste the morning of surgery.  Please DO NOT use if you have  an allergy to CHG or antibacterial soaps. If your skin becomes reddened/irritated stop using the CHG and inform your Pre-op nurse day of surgery.  DO NOT shave (including legs and genital area) for at least 48 hours prior to your CHG shower.   Please follow these instructions carefully:  Shower with CHG soap the night before surgery. If you choose to wash your hair, wash your hair first as usual with your normal shampoo. After you shampoo, rinse your hair and body thoroughly to remove the shampoo. Use CHG as you would any other liquid soap. You  can apply CHG directly to the skin and wash gently with a clean washcloth or shower sponge. Apply the CHG soap to your body ONLY FROM THE NECK DOWN. Do not use on open wounds or open sores. Avoid contact with your eyes, ears, mouth, and genitals (private parts). Wash genitals (private parts) with your normal soap. Wash thoroughly, paying special attention to the area where your surgery will be performed. Thoroughly rinse your body with warm water from the neck down. DO NOT shower/wash with your normal soap after using and rinsing off the CHG soap. DO NOT use lotions, oils, etc., after showering with CHG. Pat yourself dry with a clean towel. Wear clean pajamas. Place clean sheets on your bed the night of your CHG shower and do not sleep with pets.  Day of Surgery  DO NOT Apply any lotions,  powder,  oils,  deodorants (may use underarm deodorant),  cologne/  perfumes  or makeup Do Not wear jewelry /  piercing's/  metal/  permanent jewelry must be removed prior to arrival day of surgery. (No plastic piercing) Do Not wear nail polish,  gel polish,  artificial nails, or any other type of covering on natural finger nails (toe nails are okay) Remember to brush your teeth and rinse mouth out. Put on clean / comfortable clothes. [facility] is not responsible for valuables/ personal belongings

## 2023-12-23 NOTE — Progress Notes (Signed)
 Spoke w/ via phone for pre-op interview--- pt Lab needs dos----  upt       Lab results------ lab appt 01-04-2024 @ 0900 getting CBC/ T&S COVID test -----patient states asymptomatic no test needed Arrive at ------- 0630 on 01-04-2024 NPO after MN w/ exception sips of water w/ meds Pre-Surgery Ensure or G2: n/a  Med rec completed Medications to take morning of surgery ----- prilosec Diabetic medication ----- n/a  GLP1 agonist last dose: pt stated last dose to be 12-24-2023 GLP1 instructions: pt stated given instructions by dr boswell to stop week prior to surgery  Patient instructed no nail polish to be worn day of surgery Patient instructed to bring photo id and insurance card day of surgery Patient aware to have Driver (ride ) / caregiver    for 24 hours after surgery -  husband, Brittany Hart Patient Special Instructions ----- will pick up soap and written instructions at lab appt Pre-Op special Instructions ----- n/a  Patient verbalized understanding of instructions that were given at this phone interview. Patient denies chest pain, sob, fever, cough at the interview.

## 2024-01-03 ENCOUNTER — Ambulatory Visit: Payer: Self-pay | Admitting: Obstetrics and Gynecology

## 2024-01-03 ENCOUNTER — Encounter (HOSPITAL_COMMUNITY)
Admission: RE | Admit: 2024-01-03 | Discharge: 2024-01-03 | Disposition: A | Source: Ambulatory Visit | Attending: Obstetrics and Gynecology

## 2024-01-03 DIAGNOSIS — Z01812 Encounter for preprocedural laboratory examination: Secondary | ICD-10-CM | POA: Insufficient documentation

## 2024-01-03 DIAGNOSIS — Z01818 Encounter for other preprocedural examination: Secondary | ICD-10-CM

## 2024-01-03 LAB — CBC
HCT: 42.4 % (ref 36.0–46.0)
Hemoglobin: 14.6 g/dL (ref 12.0–15.0)
MCH: 31.3 pg (ref 26.0–34.0)
MCHC: 34.4 g/dL (ref 30.0–36.0)
MCV: 91 fL (ref 80.0–100.0)
Platelets: 309 K/uL (ref 150–400)
RBC: 4.66 MIL/uL (ref 3.87–5.11)
RDW: 11.8 % (ref 11.5–15.5)
WBC: 5.4 K/uL (ref 4.0–10.5)
nRBC: 0 % (ref 0.0–0.2)

## 2024-01-03 LAB — TYPE AND SCREEN
ABO/RH(D): A POS
Antibody Screen: NEGATIVE

## 2024-01-03 MED ORDER — SODIUM CHLORIDE 0.9 % IV SOLN
INTRAVENOUS | Status: DC
  Filled 2024-01-03: qty 10

## 2024-01-04 ENCOUNTER — Ambulatory Visit (HOSPITAL_COMMUNITY): Payer: Self-pay | Admitting: Anesthesiology

## 2024-01-04 ENCOUNTER — Encounter (HOSPITAL_COMMUNITY): Payer: Self-pay | Admitting: Obstetrics and Gynecology

## 2024-01-04 ENCOUNTER — Ambulatory Visit (HOSPITAL_COMMUNITY)
Admission: RE | Admit: 2024-01-04 | Discharge: 2024-01-04 | Disposition: A | Attending: Obstetrics and Gynecology | Admitting: Obstetrics and Gynecology

## 2024-01-04 ENCOUNTER — Encounter (HOSPITAL_COMMUNITY)
Admission: RE | Disposition: A | Discharge status: Home / Self Care | Payer: Self-pay | Source: Home / Self Care | Attending: Obstetrics and Gynecology

## 2024-01-04 DIAGNOSIS — Z79899 Other long term (current) drug therapy: Secondary | ICD-10-CM | POA: Insufficient documentation

## 2024-01-04 DIAGNOSIS — D649 Anemia, unspecified: Secondary | ICD-10-CM | POA: Insufficient documentation

## 2024-01-04 DIAGNOSIS — D252 Subserosal leiomyoma of uterus: Secondary | ICD-10-CM | POA: Insufficient documentation

## 2024-01-04 DIAGNOSIS — Z6832 Body mass index (BMI) 32.0-32.9, adult: Secondary | ICD-10-CM | POA: Insufficient documentation

## 2024-01-04 DIAGNOSIS — N80352 Endometriosis of the left pelvic sidewall, unspecified depth: Secondary | ICD-10-CM | POA: Diagnosis not present

## 2024-01-04 DIAGNOSIS — R5383 Other fatigue: Secondary | ICD-10-CM | POA: Insufficient documentation

## 2024-01-04 DIAGNOSIS — N80329 Endometriosis of the posterior cul-de-sac, unspecified depth: Secondary | ICD-10-CM | POA: Diagnosis not present

## 2024-01-04 DIAGNOSIS — N920 Excessive and frequent menstruation with regular cycle: Secondary | ICD-10-CM | POA: Diagnosis not present

## 2024-01-04 DIAGNOSIS — K219 Gastro-esophageal reflux disease without esophagitis: Secondary | ICD-10-CM | POA: Diagnosis not present

## 2024-01-04 DIAGNOSIS — N946 Dysmenorrhea, unspecified: Secondary | ICD-10-CM | POA: Insufficient documentation

## 2024-01-04 DIAGNOSIS — N879 Dysplasia of cervix uteri, unspecified: Secondary | ICD-10-CM | POA: Diagnosis not present

## 2024-01-04 DIAGNOSIS — N803 Endometriosis of pelvic peritoneum, unspecified: Secondary | ICD-10-CM | POA: Diagnosis not present

## 2024-01-04 DIAGNOSIS — M2550 Pain in unspecified joint: Secondary | ICD-10-CM | POA: Diagnosis not present

## 2024-01-04 DIAGNOSIS — N736 Female pelvic peritoneal adhesions (postinfective): Secondary | ICD-10-CM | POA: Insufficient documentation

## 2024-01-04 DIAGNOSIS — Z01818 Encounter for other preprocedural examination: Secondary | ICD-10-CM

## 2024-01-04 DIAGNOSIS — R4189 Other symptoms and signs involving cognitive functions and awareness: Secondary | ICD-10-CM | POA: Diagnosis not present

## 2024-01-04 DIAGNOSIS — N72 Inflammatory disease of cervix uteri: Secondary | ICD-10-CM | POA: Diagnosis not present

## 2024-01-04 DIAGNOSIS — R635 Abnormal weight gain: Secondary | ICD-10-CM | POA: Insufficient documentation

## 2024-01-04 HISTORY — PX: CYSTOSCOPY: SHX5120

## 2024-01-04 HISTORY — DX: Dysmenorrhea, unspecified: N94.6

## 2024-01-04 HISTORY — DX: Nausea with vomiting, unspecified: R11.2

## 2024-01-04 HISTORY — PX: HYSTERECTOMY, TOTAL, LAPAROSCOPIC, ROBOT-ASSISTED WITH SALPINGECTOMY: SHX7587

## 2024-01-04 HISTORY — DX: Anemia, unspecified: D64.9

## 2024-01-04 HISTORY — DX: Family history of other specified conditions: Z84.89

## 2024-01-04 HISTORY — DX: Personal history of other diseases of the nervous system and sense organs: Z86.69

## 2024-01-04 HISTORY — DX: Gastro-esophageal reflux disease without esophagitis: K21.9

## 2024-01-04 HISTORY — PX: EXCISION, ENDOMETRIOSIS, ROBOTIC ASSISTED, LAPAROSCOPIC: SHX7564

## 2024-01-04 HISTORY — DX: Nontoxic single thyroid nodule: E04.1

## 2024-01-04 HISTORY — DX: Other specified anxiety disorders: F41.8

## 2024-01-04 HISTORY — DX: Excessive and frequent menstruation with regular cycle: N92.0

## 2024-01-04 LAB — ABO/RH: ABO/RH(D): A POS

## 2024-01-04 LAB — POCT PREGNANCY, URINE: Preg Test, Ur: NEGATIVE

## 2024-01-04 SURGERY — HYSTERECTOMY, TOTAL, LAPAROSCOPIC, ROBOT-ASSISTED WITH SALPINGECTOMY
Anesthesia: General

## 2024-01-04 MED ORDER — SODIUM CHLORIDE 0.9 % IV SOLN
INTRAVENOUS | Status: DC | PRN
  Administered 2024-01-04: 1000 mL

## 2024-01-04 MED ORDER — ORAL CARE MOUTH RINSE: 15.0000 mL | Freq: Once | OROMUCOSAL | Status: AC

## 2024-01-04 MED ORDER — METRONIDAZOLE 500 MG/100ML IV SOLN
INTRAVENOUS | Status: AC
  Filled 2024-01-04: qty 100

## 2024-01-04 MED ORDER — MIDAZOLAM HCL (PF) 2 MG/2ML IJ SOLN
INTRAMUSCULAR | Status: DC | PRN
  Administered 2024-01-04: 2 mg via INTRAVENOUS

## 2024-01-04 MED ORDER — KETOROLAC TROMETHAMINE 30 MG/ML IJ SOLN
INTRAMUSCULAR | Status: AC
  Filled 2024-01-04: qty 1

## 2024-01-04 MED ORDER — LACTATED RINGERS IV SOLN: INTRAVENOUS | Status: DC

## 2024-01-04 MED ORDER — POVIDONE-IODINE 10 % EX SWAB: 2.0000 | Freq: Once | CUTANEOUS | Status: DC

## 2024-01-04 MED ORDER — FENTANYL CITRATE (PF) 100 MCG/2ML IJ SOLN
25.0000 ug | INTRAMUSCULAR | Status: DC | PRN
  Administered 2024-01-04: 50 ug via INTRAVENOUS

## 2024-01-04 MED ORDER — ONDANSETRON HCL 4 MG/2ML IJ SOLN
INTRAMUSCULAR | Status: DC | PRN
  Administered 2024-01-04: 4 mg via INTRAVENOUS

## 2024-01-04 MED ORDER — ACETAMINOPHEN 500 MG PO TABS
1000.0000 mg | ORAL_TABLET | ORAL | Status: AC
  Administered 2024-01-04: 1000 mg via ORAL

## 2024-01-04 MED ORDER — CHLORHEXIDINE GLUCONATE 0.12 % MT SOLN
OROMUCOSAL | Status: DC
  Filled 2024-01-04: qty 15

## 2024-01-04 MED ORDER — KETOROLAC TROMETHAMINE 30 MG/ML IJ SOLN
INTRAMUSCULAR | Status: DC | PRN
  Administered 2024-01-04: 30 mg via INTRAVENOUS

## 2024-01-04 MED ORDER — DROPERIDOL 2.5 MG/ML IJ SOLN: 0.6250 mg | Freq: Once | INTRAMUSCULAR | Status: DC | PRN

## 2024-01-04 MED ORDER — ALBUMIN HUMAN 5 % IV SOLN: INTRAVENOUS | Status: DC | PRN

## 2024-01-04 MED ORDER — LIDOCAINE 2% (20 MG/ML) 5 ML SYRINGE
INTRAMUSCULAR | Status: AC
  Filled 2024-01-04: qty 5

## 2024-01-04 MED ORDER — ROCURONIUM BROMIDE 10 MG/ML (PF) SYRINGE
PREFILLED_SYRINGE | INTRAVENOUS | Status: DC | PRN
  Administered 2024-01-04: 50 mg via INTRAVENOUS
  Administered 2024-01-04: 30 mg via INTRAVENOUS

## 2024-01-04 MED ORDER — PROPOFOL 10 MG/ML IV BOLUS
INTRAVENOUS | Status: DC | PRN
  Administered 2024-01-04: 120 mg via INTRAVENOUS
  Administered 2024-01-04: 150 ug/kg/min via INTRAVENOUS

## 2024-01-04 MED ORDER — FENTANYL CITRATE (PF) 100 MCG/2ML IJ SOLN
INTRAMUSCULAR | Status: AC
  Filled 2024-01-04: qty 2

## 2024-01-04 MED ORDER — SODIUM CHLORIDE 0.9 % IV SOLN: INTRAVENOUS | Status: DC | PRN

## 2024-01-04 MED ORDER — OXYCODONE HCL 5 MG PO TABS
5.0000 mg | ORAL_TABLET | Freq: Once | ORAL | Status: AC | PRN
  Administered 2024-01-04: 5 mg via ORAL

## 2024-01-04 MED ORDER — BUPIVACAINE HCL (PF) 0.5 % IJ SOLN
INTRAMUSCULAR | Status: AC
  Filled 2024-01-04: qty 90

## 2024-01-04 MED ORDER — FENTANYL CITRATE (PF) 250 MCG/5ML IJ SOLN
INTRAMUSCULAR | Status: DC | PRN
  Administered 2024-01-04: 50 ug via INTRAVENOUS
  Administered 2024-01-04: 100 ug via INTRAVENOUS
  Administered 2024-01-04: 50 ug via INTRAVENOUS

## 2024-01-04 MED ORDER — BUPIVACAINE HCL (PF) 0.5 % IJ SOLN
INTRAMUSCULAR | Status: DC | PRN
  Administered 2024-01-04: 22 mL

## 2024-01-04 MED ORDER — OXYCODONE HCL 5 MG PO TABS
ORAL_TABLET | ORAL | Status: AC
  Filled 2024-01-04: qty 1

## 2024-01-04 MED ORDER — DEXMEDETOMIDINE HCL IN NACL 80 MCG/20ML IV SOLN
INTRAVENOUS | Status: DC | PRN
  Administered 2024-01-04: 12 ug via INTRAVENOUS

## 2024-01-04 MED ORDER — PROPOFOL 10 MG/ML IV BOLUS
INTRAVENOUS | Status: AC
  Filled 2024-01-04: qty 20

## 2024-01-04 MED ORDER — SODIUM CHLORIDE 0.9 % IV SOLN
INTRAVENOUS | Status: AC
  Filled 2024-01-04: qty 2

## 2024-01-04 MED ORDER — FENTANYL CITRATE (PF) 250 MCG/5ML IJ SOLN
INTRAMUSCULAR | Status: AC
  Filled 2024-01-04: qty 5

## 2024-01-04 MED ORDER — ROCURONIUM BROMIDE 10 MG/ML (PF) SYRINGE
PREFILLED_SYRINGE | INTRAVENOUS | Status: AC
  Filled 2024-01-04: qty 10

## 2024-01-04 MED ORDER — 0.9 % SODIUM CHLORIDE (POUR BTL) OPTIME
TOPICAL | Status: DC | PRN
  Administered 2024-01-04: 1000 mL

## 2024-01-04 MED ORDER — OXYCODONE HCL 5 MG/5ML PO SOLN: 5.0000 mg | Freq: Once | ORAL | Status: AC | PRN

## 2024-01-04 MED ORDER — MIDAZOLAM HCL 2 MG/2ML IJ SOLN
INTRAMUSCULAR | Status: AC
  Filled 2024-01-04: qty 2

## 2024-01-04 MED ORDER — LIDOCAINE 2% (20 MG/ML) 5 ML SYRINGE
INTRAMUSCULAR | Status: DC | PRN
  Administered 2024-01-04: 60 mg via INTRAVENOUS

## 2024-01-04 MED ORDER — ACETAMINOPHEN 500 MG PO TABS
ORAL_TABLET | ORAL | Status: DC
  Filled 2024-01-04: qty 2

## 2024-01-04 MED ORDER — SODIUM CHLORIDE 0.9 % IV SOLN
2.0000 g | INTRAVENOUS | Status: AC
  Administered 2024-01-04: 2 g via INTRAVENOUS
  Filled 2024-01-04: qty 2

## 2024-01-04 MED ORDER — SUGAMMADEX SODIUM 200 MG/2ML IV SOLN
INTRAVENOUS | Status: DC | PRN
  Administered 2024-01-04: 50 mg via INTRAVENOUS
  Administered 2024-01-04: 335.6 mg via INTRAVENOUS
  Administered 2024-01-04: 50 mg via INTRAVENOUS

## 2024-01-04 MED ORDER — METRONIDAZOLE 500 MG/100ML IV SOLN
500.0000 mg | INTRAVENOUS | Status: AC
  Administered 2024-01-04: 500 mg via INTRAVENOUS
  Filled 2024-01-04: qty 100

## 2024-01-04 MED ORDER — DEXAMETHASONE SOD PHOSPHATE PF 10 MG/ML IJ SOLN
INTRAMUSCULAR | Status: DC | PRN
  Administered 2024-01-04: 10 mg via INTRAVENOUS

## 2024-01-04 MED ORDER — ONDANSETRON HCL 4 MG/2ML IJ SOLN
INTRAMUSCULAR | Status: AC
  Filled 2024-01-04: qty 2

## 2024-01-04 MED ORDER — PHENYLEPHRINE 80 MCG/ML (10ML) SYRINGE FOR IV PUSH (FOR BLOOD PRESSURE SUPPORT)
PREFILLED_SYRINGE | INTRAVENOUS | Status: DC | PRN
  Administered 2024-01-04: 80 ug via INTRAVENOUS
  Administered 2024-01-04: 160 ug via INTRAVENOUS

## 2024-01-04 MED ORDER — CHLORHEXIDINE GLUCONATE 0.12 % MT SOLN
15.0000 mL | Freq: Once | OROMUCOSAL | Status: AC
  Administered 2024-01-04: 15 mL via OROMUCOSAL

## 2024-01-04 SURGICAL SUPPLY — 40 items
COVER BACK TABLE 60X90IN (DRAPES) ×2 IMPLANT
COVER TIP SHEARS 8 DVNC (MISCELLANEOUS) ×2 IMPLANT
DEFOGGER SCOPE WARM SEASHARP (MISCELLANEOUS) ×2 IMPLANT
DERMABOND ADVANCED .7 DNX12 (GAUZE/BANDAGES/DRESSINGS) ×2 IMPLANT
DRAPE ARM DVNC X/XI (DISPOSABLE) ×8 IMPLANT
DRAPE COLUMN DVNC XI (DISPOSABLE) ×2 IMPLANT
DRAPE SURG IRRIG POUCH 19X23 (DRAPES) ×2 IMPLANT
DRAPE UTILITY XL STRL (DRAPES) ×2 IMPLANT
DRIVER NDL MEGA SUTCUT DVNCXI (INSTRUMENTS) ×2 IMPLANT
DURAPREP 26ML APPLICATOR (WOUND CARE) ×2 IMPLANT
ELECTRODE REM PT RTRN 9FT ADLT (ELECTROSURGICAL) ×2 IMPLANT
FORCEPS PROGRASP DVNC XI (FORCEP) ×2 IMPLANT
GAUZE 4X4 16PLY ~~LOC~~+RFID DBL (SPONGE) IMPLANT
GLOVE BIOGEL PI IND STRL 7.0 (GLOVE) ×4 IMPLANT
GLOVE NEODERM STER SZ 7 (GLOVE) ×6 IMPLANT
GOWN STRL REUS W/ TWL LRG LVL3 (GOWN DISPOSABLE) ×2 IMPLANT
HOLDER FOLEY CATH W/STRAP (MISCELLANEOUS) IMPLANT
IRRIGATION SUCT STRKRFLW 2 WTP (MISCELLANEOUS) ×2 IMPLANT
KIT PINK PAD W/HEAD ARM REST (MISCELLANEOUS) ×2 IMPLANT
KIT TURNOVER KIT B (KITS) ×2 IMPLANT
LEGGING LITHOTOMY PAIR STRL (DRAPES) ×2 IMPLANT
MANIFOLD NEPTUNE II (INSTRUMENTS) ×2 IMPLANT
OBTURATOR OPTICALSTD 8 DVNC (TROCAR) ×2 IMPLANT
PACK ROBOT WH (CUSTOM PROCEDURE TRAY) ×2 IMPLANT
PACK ROBOTIC GOWN (GOWN DISPOSABLE) ×2 IMPLANT
PAD OB MATERNITY 11 LF (PERSONAL CARE ITEMS) ×2 IMPLANT
POWDER SURGICEL 3.0 GRAM (HEMOSTASIS) IMPLANT
RUMI II GYRUS 3.5CM BLUE (DISPOSABLE) IMPLANT
SCISSORS MNPLR CVD DVNC XI (INSTRUMENTS) ×2 IMPLANT
SEAL UNIV 5-12 XI (MISCELLANEOUS) ×6 IMPLANT
SEALER VESSEL EXT DVNC XI (MISCELLANEOUS) IMPLANT
SET CYSTO IRRIGATION (SET/KITS/TRAYS/PACK) ×2 IMPLANT
SET TUBE SMOKE EVAC HIGH FLOW (TUBING) ×2 IMPLANT
SOLN 0.9% NACL POUR BTL 1000ML (IV SOLUTION) ×2 IMPLANT
SUT MNCRL AB 4-0 PS2 18 (SUTURE) ×2 IMPLANT
SUT VLOC 180 0 9IN GS21 (SUTURE) ×4 IMPLANT
TIP UTERINE 6.7X10CM GRN DISP (MISCELLANEOUS) IMPLANT
TOWEL GREEN STERILE (TOWEL DISPOSABLE) ×2 IMPLANT
TRAY FOLEY W/BAG SLVR 14FR (SET/KITS/TRAYS/PACK) ×2 IMPLANT
UNDERPAD 30X36 HEAVY ABSORB (UNDERPADS AND DIAPERS) ×2 IMPLANT

## 2024-01-04 NOTE — Anesthesia Preprocedure Evaluation (Signed)
 Anesthesia Evaluation  Patient identified by MRN, date of birth, ID band Patient awake    Reviewed: Allergy & Precautions, NPO status , Patient's Chart, lab work & pertinent test results  History of Anesthesia Complications (+) PONV and history of anesthetic complications  Airway Mallampati: I  TM Distance: >3 FB Neck ROM: Full    Dental no notable dental hx. (+) Teeth Intact   Pulmonary neg pulmonary ROS, neg sleep apnea, neg COPD, Patient abstained from smoking.Not current smoker   Pulmonary exam normal breath sounds clear to auscultation       Cardiovascular Exercise Tolerance: Good METS(-) hypertension(-) CAD and (-) Past MI negative cardio ROS (-) dysrhythmias  Rhythm:Regular Rate:Normal - Systolic murmurs    Neuro/Psych  Headaches PSYCHIATRIC DISORDERS Anxiety        GI/Hepatic ,GERD  Medicated and Controlled,,(+)     (-) substance abuse    Endo/Other  neg diabetes  GLP1ra held for > 7 days. Denies Gi symptoms today  Renal/GU negative Renal ROS     Musculoskeletal   Abdominal   Peds  Hematology   Anesthesia Other Findings Past Medical History: No date: ADD (attention deficit disorder) No date: Anemia No date: Dysmenorrhea No date: Family history of adverse reaction to anesthesia     Comment:  mother--- ponv No date: GERD (gastroesophageal reflux disease) 2013: History of kidney stones No date: History of migraine No date: Menorrhagia with regular cycle No date: PONV (postoperative nausea and vomiting)     Comment:  pt states gets real motion sickness No date: Situational anxiety No date: Thyroid  nodule     Comment:  ultrasound in epic 08-01-2023  left side did meet               criteria for bx 03/07/2020: Vitamin D  deficiency  Reproductive/Obstetrics                              Anesthesia Physical Anesthesia Plan  ASA: 2  Anesthesia Plan: General   Post-op Pain  Management: Tylenol  PO (pre-op)* and Toradol  IV (intra-op)*   Induction: Intravenous  PONV Risk Score and Plan: 4 or greater and Ondansetron , Dexamethasone , TIVA, Propofol  infusion and Midazolam   Airway Management Planned: Oral ETT  Additional Equipment: None  Intra-op Plan:   Post-operative Plan: Extubation in OR  Informed Consent: I have reviewed the patients History and Physical, chart, labs and discussed the procedure including the risks, benefits and alternatives for the proposed anesthesia with the patient or authorized representative who has indicated his/her understanding and acceptance.     Dental advisory given  Plan Discussed with: CRNA and Surgeon  Anesthesia Plan Comments: (Discussed risks of anesthesia with patient, including PONV, sore throat, lip/dental/eye damage. Rare risks discussed as well, such as cardiorespiratory and neurological sequelae, and allergic reactions. Discussed the role of CRNA in patient's perioperative care. Patient understands.)         Anesthesia Quick Evaluation

## 2024-01-04 NOTE — Transfer of Care (Signed)
 Immediate Anesthesia Transfer of Care Note  Patient: Brittany Hart  Procedure(s) Performed: HYSTERECTOMY, TOTAL, LAPAROSCOPIC, ROBOT-ASSISTED WITH BILATERAL SALPINGECTOMY CYSTOSCOPY EXCISION, ENDOMETRIOSIS, ROBOTIC ASSISTED, LAPAROSCOPIC  Patient Location: PACU  Anesthesia Type:General  Level of Consciousness: awake and drowsy  Airway & Oxygen Therapy: Patient Spontanous Breathing  Post-op Assessment: Report given to RN, Post -op Vital signs reviewed and stable, and Patient moving all extremities  Post vital signs: Reviewed and stable  Last Vitals:  Vitals Value Taken Time  BP 111/62 01/04/24 10:01  Temp 36.5 C 01/04/24 10:00  Pulse 100 01/04/24 10:04  Resp 18 01/04/24 10:04  SpO2 100 % 01/04/24 10:04  Vitals shown include unfiled device data.  Last Pain:  Vitals:   01/04/24 0706  TempSrc: Oral  PainSc: 0-No pain      Patients Stated Pain Goal: 5 (01/04/24 0706)  Complications: No notable events documented.

## 2024-01-04 NOTE — Op Note (Signed)
 01/04/2024  [id number] Brittany Hart        OPERATIVE REPORT   Preop Diagnosis: menorrhagia, dysmenorrhea, anemia, likely adenomyosis Procedure: robotic hysterectomy, bilateral salpingectomy, excision of endometriosis lesions, cystoscopy   Surgeon: Dr. Almarie Sauer Tyrhonda Georgiades Assistant: Judyann Rattler, RN   Circulator: Clarisse Gander A Relief Circulator: [place], Hadassah Buel RAMAN, RN Scrub Person: [place], Jackolyn VIRGIN Neysa Joesph JAYSON RN First Assistant: Rattler Judyann CROME, RN  Fluids: please see anesthesia report   Complications: None Anesthesia: General     Findings:  boggy contour 10cm uterus, normal ovaries and tubes, stage 2 endometriosis with lesions on anterior peritoneum, left anterior peritoneum and posterior culdesac. Cystoscopy at the end of the case with normal bladder and patent ureters bilaterally.   Estimated blood loss: Minimal 25cc   Specimens: Uterus, cervix and bilateral tubes, endometriosis lesions   Disposition of specimen: Pathology           Patient is taken to the operating room. She is placed in the supine position. She is a running IV in place. Informed consent was present on the chart. SCDs on her lower extremities and functioning properly. Patient was positioned while she was awake.  Her legs were placed in the low lithotomy position in [place] stirrups. Her arms were tucked by the side.  General endotracheal anesthesia was administered by the anesthesia staff without difficulty.       Dura prep was then used to prep the abdomen and Hibiclens was used to prep the inner thighs, perineum and vagina. Once 3 minutes had past the patient was draped in a normal standard fashion. A proper time out was performed and everyone agreed.  The legs were lifted to the high lithotomy position. A bivalve speculum was inserted into the vagina and the anterior lip of the cervix was grasped with single-tooth tenaculum.  The uterus sounded to 10 cm. Pratt dilators were  used to dilate the cervix.  The RUMI uterine manipulator was obtained inserted into the endometrial cavity and the bulb of the disposable tip was inflated with 8 cc of normal saline. There was a good fit of the KOH ring around the cervix. The tenaculum and bivavle speculum was removed. There is also good manipulation of the uterus.  A Foley catheter was placed to straight drain.  Clear urine was noted. Legs were lowered to the low lithotomy position and attention was turned the abdomen.   Superior to the umbilicus, marcaine 0.25% used to anesthetize the skin.  Using #11 blade, 8mm skin incision was made.  The 8mm robotic trocar and sleeve was inserted under direct visualization.  CO2 gas was  started and patient was placed in trendelenburg position.  Two additional 8mm ports were placed under direct visualization in the left and right lower quadrant.     Ureters were identifies.  Attention was turned to the endometriosis lesions and these were excised and sent to pathology. The left tube was elevated and the mesosalpinx was desiccated with the vessel sealer.  The left uterine ovarian pedicle was serially clamped cauterized and incised. Left round ligament was serially clamped cauterized and incised. The anterior and posterior peritoneum of the inferior leaf of the broad ligament were opened. The beginning of the bladder flap was created.  The bladder was taken down below the level of the KOH ring. The left uterine artery skeletonized and then just superior to the KOH ring this vessel was serially clamped, cauterized, and incised.   Attention was turned the right side.  The uterus was placed on stretch to the opposite side.    The mesosalpinx was incised freeing the tube. Then the right uterine ovarian pedicle was serially clamped cauterized and incised. Next the right round ligament was serially clamped cauterized and incised. The anterior posterior peritoneum of the inferiorly for the broad ligament were  opened. The anterior peritoneum was carried across to the dissection on the left side. The remainder of the bladder flap was created using sharp dissection. The bladder was well below the level of the KOH ring. The right uterine artery skeletonized. Then the right uterine artery, above the level of the KOH ring, was serially clamped cauterized and incised. The uterus was devascularized at this point.   The colpotomy was performed.  This was carried around a circumferential fashion until the vaginal mucosa was completely incised in the specimen was freed.  The specimen was then delivered to the vagina intact.  A vaginal occlusive device was used to maintain the pneumoperitoneum   Instruments were changed with a needle driver and prograsp.  Using a 9 inch  zero V-lock suture, the cuff was closed by incorporating the anterior and posterior vaginal mucosa in each stitch. This was carried across all the way to the left corner and a running fashion. Two stitches were brought back towards the midline and the suture was cut flush with the vagina. The needle was brought out the pelvis. The pelvis was irrigated. All pedicles were inspected. No bleeding was noted.   Co2 pressures were lowered to 8mm Hg.  Again, no bleeding was noted.  Ureters were noted deep in the pelvis to be peristalsing.  At this point the procedure was completed.  The remaining instruments were removed.  The ports were removed under direct visualization of the laparoscope and the pneumoperitoneum was relieved.   The skin was then closed with subcuticular stitches of 3-0 Vicryl. The skin was cleansed Dermabond was applied. Attention was then turned the vagina and the cuff was inspected. No bleeding was noted.  The Foley catheter was removed.  Cystoscopy was performed.  No sutures or bladder injuries were noted.  Ureters were noted with normal urine jets from each one was seen.  Foley was left out after the cystoscopic fluid was drained and cystoscope  removed.  Sponge, lap, needle, instrument counts were correct x2. Patient tolerated the procedure very well. She was awakened from anesthesia, extubated and taken to recovery in stable condition.      Dr. Glennon

## 2024-01-04 NOTE — Anesthesia Postprocedure Evaluation (Signed)
 Anesthesia Post Note  Patient: Brittany Hart  Procedure(s) Performed: HYSTERECTOMY, TOTAL, LAPAROSCOPIC, ROBOT-ASSISTED WITH BILATERAL SALPINGECTOMY CYSTOSCOPY EXCISION, ENDOMETRIOSIS, ROBOTIC ASSISTED, LAPAROSCOPIC     Patient location during evaluation: PACU Anesthesia Type: General Level of consciousness: awake and alert Pain management: pain level controlled Vital Signs Assessment: post-procedure vital signs reviewed and stable Respiratory status: spontaneous breathing, nonlabored ventilation, respiratory function stable and patient connected to nasal cannula oxygen Cardiovascular status: blood pressure returned to baseline and stable Postop Assessment: no apparent nausea or vomiting Anesthetic complications: no   No notable events documented.  Last Vitals:  Vitals:   01/04/24 1000 01/04/24 1015  BP: 111/62 (!) 103/57  Pulse: (!) 101 87  Resp: 20 14  Temp: 36.5 C   SpO2: 100% 98%    Last Pain:  Vitals:   01/04/24 1015  TempSrc:   PainSc: 4                  Rome Ade

## 2024-01-04 NOTE — Interval H&P Note (Signed)
 History and Physical Interval Note:  01/04/2024 8:13 AM  Brittany Hart  has presented today for surgery, with the diagnosis of Menorrhagia with regular cycle, dysmenorrhea.  The various methods of treatment have been discussed with the patient and family. After consideration of risks, benefits and other options for treatment, the patient has consented to  Procedure(s): HYSTERECTOMY, TOTAL, LAPAROSCOPIC, ROBOT-ASSISTED WITH BILATERAL SALPINGECTOMY (N/A) CYSTOSCOPY (N/A) as a surgical intervention.  The patient's history has been reviewed, patient examined, no change in status, stable for surgery.  I have reviewed the patient's chart and labs.  Questions were answered to the patient's satisfaction.     [name]

## 2024-01-04 NOTE — Anesthesia Procedure Notes (Signed)
 Procedure Name: Intubation Date/Time: 01/04/2024 8:34 AM  Performed by: Jerl Donald LABOR, CRNAPre-anesthesia Checklist: Patient identified, Emergency Drugs available, Suction available and Patient being monitored Patient Re-evaluated:Patient Re-evaluated prior to induction Oxygen Delivery Method: Circle System Utilized Preoxygenation: Pre-oxygenation with 100% oxygen Induction Type: IV induction Ventilation: Mask ventilation without difficulty Laryngoscope Size: Mac and 3 Grade View: Grade I Tube type: Oral Tube size: 7.0 mm Number of attempts: 1 Airway Equipment and Method: Stylet Placement Confirmation: ETT inserted through vocal cords under direct vision, positive ETCO2 and breath sounds checked- equal and bilateral Secured at: 21 cm Tube secured with: Tape Dental Injury: Teeth and Oropharynx as per pre-operative assessment

## 2024-01-05 ENCOUNTER — Ambulatory Visit: Payer: Self-pay | Admitting: Obstetrics and Gynecology

## 2024-01-05 ENCOUNTER — Encounter (HOSPITAL_COMMUNITY): Payer: Self-pay | Admitting: Obstetrics and Gynecology

## 2024-01-05 LAB — SURGICAL PATHOLOGY

## 2024-01-18 ENCOUNTER — Encounter: Payer: Self-pay | Admitting: Obstetrics and Gynecology

## 2024-01-18 ENCOUNTER — Ambulatory Visit: Admitting: Obstetrics and Gynecology

## 2024-01-18 VITALS — BP 122/72 | HR 89 | Ht 63.0 in | Wt 186.0 lb

## 2024-01-18 DIAGNOSIS — Z09 Encounter for follow-up examination after completed treatment for conditions other than malignant neoplasm: Secondary | ICD-10-CM

## 2024-01-18 NOTE — Progress Notes (Signed)
 2 week follow up:  Would like results broken down for her.

## 2024-01-18 NOTE — Progress Notes (Signed)
 Patient presents for 2 week postop from [facility], bilateral salpingectomy, cystoscopy. She is doing well. No fevers, VB, dysuria or severe abdominal pain.  BP 122/72 (BP Location: Left Arm, Patient Position: Sitting, Cuff Size: Normal)   Pulse 89   Ht 5' 3 (1.6 m)   Wt 186 lb (84.4 kg)   SpO2 98%   BMI 32.95 kg/m  Body mass index is 32.95 kg/m. 06/29/23 212lbs  BMI 37.5 currently on zepbound   Abdomen: incisions I/c/d, NT, ND  A/p PO from [facility] 2 weeks doing well Encouraged no heavy lifting, pushing, pulling greater than 10 lbs for full 8 weeks 2. Pelvic rest until cleared 3. RTC with any concerns or with heavy bleeding, fevers or severe abdominal pain.  Dr. Glennon

## 2024-02-08 ENCOUNTER — Ambulatory Visit (INDEPENDENT_AMBULATORY_CARE_PROVIDER_SITE_OTHER): Admitting: Physician Assistant

## 2024-02-08 ENCOUNTER — Encounter: Payer: Self-pay | Admitting: Physician Assistant

## 2024-02-08 DIAGNOSIS — F418 Other specified anxiety disorders: Secondary | ICD-10-CM

## 2024-02-08 DIAGNOSIS — F900 Attention-deficit hyperactivity disorder, predominantly inattentive type: Secondary | ICD-10-CM | POA: Diagnosis not present

## 2024-02-08 MED ORDER — AMPHETAMINE-DEXTROAMPHET ER 10 MG PO CP24: 10.0000 mg | ORAL_CAPSULE | Freq: Every day | ORAL | 0 refills | Status: AC

## 2024-02-08 NOTE — Progress Notes (Signed)
 "     [facility] Med Check  Patient ID: Brittany Hart,  MRN: [id number]  PCP: Chet Mad, DO  Date of Evaluation: 02/08/2024 Time spent:20 minutes  Chief Complaint:  Chief Complaint   ADHD; Follow-up    HISTORY/CURRENT STATUS: HPI for routine med check.  Was found to have endometriosis and had a hysterectomy in early Dec. That helped the back pain right away and the brain fog lifted. Off the Vyvanse  for about 3-4 weeks, thinking she didn't need it.  But she can tell a difference in her ability to focus and get things done.  She is still out on medical leave with plans to return in a few weeks.  She is wondering if retrying Adderall XR would be appropriate.  The Vyvanse  sometimes lasts too long during the day.   Having a hysterectomy has positively affected her mood as well.  Energy and motivation are good.  No extreme sadness, tearfulness, or feelings of hopelessness.  Sleeps well most of the time. ADLs and personal hygiene are normal.    Appetite has not changed.  Weight is stable.  No complaints of anxiety.  No mania, delirium, AH/VH.  No SI/HI.  Individual Medical History/ Review of Systems: Changes? :Yes see HPI  Past medications for mental health diagnoses include: Adzenys  (when Adderall shortage) Vyvanse , Adderall  Allergies: Patient has no known allergies.  Current Medications:  Current Outpatient Medications:    amphetamine -dextroamphetamine  (ADDERALL XR) 10 MG 24 hr capsule, Take 1 capsule (10 mg total) by mouth daily., Disp: 30 capsule, Rfl: 0   ergocalciferol  (VITAMIN D2) 1.25 MG (50000 UT) capsule, Take 50,000 Units by mouth once a week. Wednesday's, Disp: , Rfl:    ibuprofen  (ADVIL ) 800 MG tablet, Take 1 tablet (800 mg total) by mouth every 8 (eight) hours as needed., Disp: 30 tablet, Rfl: 1   omeprazole (PRILOSEC OTC) 20 MG tablet, Take 20 mg by mouth daily as needed., Disp: , Rfl:    oxyCODONE  (ROXICODONE ) 5 MG immediate release tablet, Take 1 tablet (5 mg  total) by mouth every 4 (four) hours as needed., Disp: 5 tablet, Rfl: 0   vitamin B-12 (CYANOCOBALAMIN ) 500 MCG tablet, Take 500 mcg by mouth daily., Disp: , Rfl:    metoCLOPramide  (REGLAN ) 10 MG tablet, Take 1 tablet (10 mg total) by mouth every 8 (eight) hours as needed., Disp: 5 tablet, Rfl: 0   tirzepatide  (ZEPBOUND ) 10 MG/0.5ML injection vial, Inject 10 mg into the skin once a week., Disp: 2 mL, Rfl: 0 Medication Side Effects: none  Family Medical/ Social History: Changes? No  MENTAL HEALTH EXAM:  There were no vitals taken for this visit.There is no height or weight on file to calculate BMI.  General Appearance: Casual, Neat and Well Groomed  Eye Contact:  Good  Speech:  Clear and Coherent  Volume:  Normal  Mood:  Euthymic  Affect:  Appropriate  Thought Process:  Goal Directed and Descriptions of Associations: Circumstantial  Orientation:  Full (Time, Place, and Person)  Thought Content: Logical   Suicidal Thoughts:  No  Homicidal Thoughts:  No  Memory:  WNL  Judgement:  Good  Insight:  Good  Psychomotor Activity:  Normal  Concentration:  Concentration: Good and Attention Span: Fair  Recall:  Good  Fund of Knowledge: Good  Language: Good  Assets:  Communication Skills Desire for Improvement Financial Resources/Insurance Housing Resilience Transportation Vocational/Educational  ADL's:  Intact  Cognition: WNL  Prognosis:  Good   DIAGNOSES:    ICD-10-CM  1. Attention deficit hyperactivity disorder (ADHD), predominantly inattentive type  F90.0     2. Situational anxiety  F41.8      Receiving Psychotherapy: No   RECOMMENDATIONS:  PDMP was reviewed.  Last Vyvanse  filled 12/06/2023. I provided approximately  20 minutes of face to face time during this encounter, including time spent before and after the visit in records review, medical decision making, counseling pertinent to today's visit, and charting.   I am glad she is feeling much better in general.  I  recommend changing back to Adderall XR, hopefully that will be beneficial enough to get her through her workday.  She would like to try it.  She may need to stay out of work a little longer than anticipated.  If needed I can write her out, but if GYN will do it that is fine too.  I feel like she needs more time to get adjusted to the Adderall.  Discontinue Vyvanse .  Restart Adderall XR 10 mg, 1 p.o. every morning. Continue vitamins.  Return in 4 weeks.   Verneita Cooks, PA-C  "

## 2024-02-14 ENCOUNTER — Ambulatory Visit (INDEPENDENT_AMBULATORY_CARE_PROVIDER_SITE_OTHER): Admitting: Obstetrics and Gynecology

## 2024-02-14 VITALS — BP 120/84 | HR 76 | Wt 186.0 lb

## 2024-02-14 DIAGNOSIS — Z09 Encounter for follow-up examination after completed treatment for conditions other than malignant neoplasm: Secondary | ICD-10-CM

## 2024-02-14 MED ORDER — ZEPBOUND 10 MG/0.5ML ~~LOC~~ SOLN: 10.0000 mg | SUBCUTANEOUS | 0 refills | Status: AC

## 2024-02-14 MED ORDER — ZEPBOUND 10 MG/0.5ML ~~LOC~~ SOLN: 10.0000 mg | SUBCUTANEOUS | 0 refills | Status: DC

## 2024-02-14 NOTE — Progress Notes (Signed)
 Patient presents for 6 week postop from [facility], bilateral salpingectomy, cystoscopy. She is doing well. No fevers, VB, dysuria or severe abdominal pain. Not sexually active No abnormal discharge.  BP 120/84   Pulse 76   Wt 186 lb (84.4 kg)   LMP 11/28/2023   BMI 32.95 kg/m    Feels a plateau in her weight. To increase zepbound  to 10mg  q week  A/p PO from [facility] 6 weeks doing well Encouraged no heavy lifting, pushing, pulling greater than 10 lbs for full 8 weeks 2. Pelvic rest until cleared 3. RTC with any concerns or with heavy bleeding, fevers or severe abdominal pain and with 10 wk visit 4. Zepbouond 10mg  weekly sent to her pharmacy. Return to work on Monday the 19th.  Dr. Glennon

## 2024-03-12 ENCOUNTER — Encounter: Admitting: Obstetrics and Gynecology

## 2024-03-13 ENCOUNTER — Encounter: Admitting: Obstetrics and Gynecology

## 2024-03-20 ENCOUNTER — Ambulatory Visit: Admitting: Physician Assistant
